# Patient Record
Sex: Male | Born: 1975 | Race: White | Hispanic: No | Marital: Single | Smoking: Never smoker
Health system: Southern US, Community
[De-identification: ages and names within clinical notes are randomized; demographics above are authoritative.]

## PROBLEM LIST (undated history)

## (undated) DIAGNOSIS — R002 Palpitations: Secondary | ICD-10-CM

## (undated) DIAGNOSIS — J45909 Unspecified asthma, uncomplicated: Secondary | ICD-10-CM

## (undated) DIAGNOSIS — I10 Essential (primary) hypertension: Secondary | ICD-10-CM

## (undated) HISTORY — DX: Essential (primary) hypertension: I10

## (undated) HISTORY — DX: Palpitations: R00.2

## (undated) HISTORY — DX: Unspecified asthma, uncomplicated: J45.909

## (undated) HISTORY — PX: ANTERIOR CRUCIATE LIGAMENT REPAIR: SHX115

---

## 1998-07-20 ENCOUNTER — Emergency Department (HOSPITAL_COMMUNITY): Admission: EM | Admit: 1998-07-20 | Discharge: 1998-07-20 | Payer: Self-pay | Admitting: Emergency Medicine

## 1999-04-19 ENCOUNTER — Encounter: Payer: Self-pay | Admitting: Emergency Medicine

## 1999-04-19 ENCOUNTER — Emergency Department (HOSPITAL_COMMUNITY): Admission: EM | Admit: 1999-04-19 | Discharge: 1999-04-19 | Payer: Self-pay | Admitting: Emergency Medicine

## 1999-08-09 ENCOUNTER — Emergency Department (HOSPITAL_COMMUNITY): Admission: EM | Admit: 1999-08-09 | Discharge: 1999-08-10 | Payer: Self-pay | Admitting: Emergency Medicine

## 2000-07-03 ENCOUNTER — Emergency Department (HOSPITAL_COMMUNITY): Admission: EM | Admit: 2000-07-03 | Discharge: 2000-07-03 | Payer: Self-pay | Admitting: *Deleted

## 2006-05-30 ENCOUNTER — Ambulatory Visit: Payer: Self-pay | Admitting: Internal Medicine

## 2006-08-04 ENCOUNTER — Ambulatory Visit: Payer: Self-pay | Admitting: Orthopedic Surgery

## 2006-08-22 ENCOUNTER — Ambulatory Visit: Payer: Self-pay | Admitting: Specialist

## 2006-12-05 ENCOUNTER — Emergency Department (HOSPITAL_COMMUNITY): Admission: EM | Admit: 2006-12-05 | Discharge: 2006-12-05 | Payer: Self-pay | Admitting: Family Medicine

## 2007-01-23 ENCOUNTER — Emergency Department (HOSPITAL_COMMUNITY): Admission: EM | Admit: 2007-01-23 | Discharge: 2007-01-23 | Payer: Self-pay | Admitting: Family Medicine

## 2007-02-24 ENCOUNTER — Emergency Department (HOSPITAL_COMMUNITY): Admission: EM | Admit: 2007-02-24 | Discharge: 2007-02-24 | Payer: Self-pay | Admitting: Emergency Medicine

## 2010-10-10 ENCOUNTER — Emergency Department (HOSPITAL_COMMUNITY)
Admission: EM | Admit: 2010-10-10 | Discharge: 2010-10-10 | Payer: Self-pay | Source: Home / Self Care | Admitting: Emergency Medicine

## 2013-10-01 ENCOUNTER — Ambulatory Visit: Payer: Self-pay | Attending: Internal Medicine | Admitting: Internal Medicine

## 2013-10-01 VITALS — BP 134/87 | HR 112 | Temp 97.7°F | Resp 17

## 2013-10-01 DIAGNOSIS — R079 Chest pain, unspecified: Secondary | ICD-10-CM

## 2013-10-01 DIAGNOSIS — I10 Essential (primary) hypertension: Secondary | ICD-10-CM | POA: Insufficient documentation

## 2013-10-01 DIAGNOSIS — J45909 Unspecified asthma, uncomplicated: Secondary | ICD-10-CM | POA: Insufficient documentation

## 2013-10-01 DIAGNOSIS — R002 Palpitations: Secondary | ICD-10-CM | POA: Insufficient documentation

## 2013-10-01 MED ORDER — ALBUTEROL SULFATE HFA 108 (90 BASE) MCG/ACT IN AERS: 2.0000 | INHALATION_SPRAY | Freq: Four times a day (QID) | RESPIRATORY_TRACT | Status: DC | PRN

## 2013-10-01 MED ORDER — LISINOPRIL-HYDROCHLOROTHIAZIDE 20-12.5 MG PO TABS: 1.0000 | ORAL_TABLET | Freq: Every day | ORAL | Status: DC

## 2013-10-01 NOTE — Progress Notes (Signed)
Patient ID: Ian Cochran, male   DOB: Jun 14, 1976, 37 y.o.   MRN: [id number]   Chief complaints Establish care  History of present illness 37 year old obese male with history of asthma and recently diagnosed for hypertension at an urgent care comes in to establish care. Patient reports that for the last 2 weeks he has been experiencing some palpitations on  exertion and climbing stairs. He denies any headache, blurred vision, dizziness, chest pain, fever, chills, nausea, vomiting, abdominal pain, leg swelling, bowel or urinary symptoms. Since he was diagnosed of high blood pressure 3 months back he has been monitoring his diet and trying to exercise in order to lose weight. He denies smoking, alcohol use or illicit drug use. He reports mild shortness of breath on exertion. He also feels more tired than usual and has been stressed out regarding his mother's health and himself being unemployed.  Vital signs in last 24 hours:  Filed Vitals:   10/01/13 1430  BP: 134/87  Pulse: 112  Temp: 97.7 F (36.5 C)  Resp: 17      Physical Exam:  General: Middle aged male in no acute distress. HEENT: no pallor, no icterus, moist oral mucosa, no JVD,  Heart: Normal  s1 &s2  Regular rate and rhythm, without murmurs, rubs, gallops. Lungs: Clear to auscultation bilaterally. Abdomen: Soft, nontender, nondistended, positive bowel sounds. Extremities: Warm, no edema Neuro: Alert, awake, oriented x3, nonfocal.   Lab Results:  Basic Metabolic Panel: No results found for this basename: na, k, cl, co2, bun, creatinine, glucose, calcium   CBC: No results found for this basename: wbc, hgb, hct, plt, mcv, neutroabs, lymphsabs, monoabs, eosabs, basosabs    No results found for this or any previous visit (from the past 240 hour(s)).  Studies/Results: No results found.  Medications: Reviewed  EKG: Normal sinus rhythm at 95, no ST T changes  Assessment/Plan: Hypertension Blood pressure stable.  I will refill his prescription for HCTZ and lisinopril. Encouraged on monitoring his diet with low-salt intake, exercising regularly in order to lose weight.  Palpitations Possibly related to stress  EKG unremarkable. Will check BMET , TSH and free T4  Asthma  will refill albuterol inhaler  Follow up in 1 month  Braedyn Kauk 10/01/2013, 2:52 PM

## 2013-10-01 NOTE — Addendum Note (Signed)
Addended by: Eddie North on: 10/01/2013 03:05 PM   Modules accepted: Orders

## 2013-10-01 NOTE — Progress Notes (Signed)
Patient here to establish care Has history of HTN Lately has been having so chest fluttering on  exhertion

## 2013-10-02 LAB — BASIC METABOLIC PANEL
BUN: 14 mg/dL (ref 6–23)
CO2: 27 mEq/L (ref 19–32)
Calcium: 9.7 mg/dL (ref 8.4–10.5)
Chloride: 105 mEq/L (ref 96–112)
Creat: 1.15 mg/dL (ref 0.50–1.35)
Glucose, Bld: 121 mg/dL — ABNORMAL HIGH (ref 70–99)
Potassium: 3.9 mEq/L (ref 3.5–5.3)
Sodium: 140 mEq/L (ref 135–145)

## 2013-10-02 LAB — T4, FREE: Free T4: 1.11 ng/dL (ref 0.80–1.80)

## 2013-10-02 LAB — TSH: TSH: 0.735 u[IU]/mL (ref 0.350–4.500)

## 2013-10-19 ENCOUNTER — Ambulatory Visit: Payer: Self-pay

## 2013-10-21 ENCOUNTER — Other Ambulatory Visit: Payer: Self-pay

## 2013-10-29 ENCOUNTER — Ambulatory Visit: Payer: Self-pay | Attending: Internal Medicine | Admitting: Internal Medicine

## 2013-10-29 ENCOUNTER — Encounter: Payer: Self-pay | Admitting: Internal Medicine

## 2013-10-29 VITALS — BP 149/88 | HR 76 | Temp 98.6°F | Resp 14 | Ht 73.2 in

## 2013-10-29 DIAGNOSIS — R002 Palpitations: Secondary | ICD-10-CM

## 2013-10-29 MED ORDER — NEBIVOLOL HCL 5 MG PO TABS: 5.0000 mg | ORAL_TABLET | Freq: Every day | ORAL | Status: DC

## 2013-10-29 MED ORDER — ASPIRIN EC 81 MG PO TBEC: 81.0000 mg | DELAYED_RELEASE_TABLET | Freq: Every day | ORAL | Status: AC

## 2013-10-29 MED ORDER — FLUTICASONE-SALMETEROL 100-50 MCG/DOSE IN AEPB: 1.0000 | INHALATION_SPRAY | Freq: Two times a day (BID) | RESPIRATORY_TRACT | Status: DC

## 2013-10-29 NOTE — Patient Instructions (Signed)
If you develop persistent palpitations, any chest pain or shortness of breath called 911 and go to ER.

## 2013-10-29 NOTE — Progress Notes (Unsigned)
Patient ID: Ian Cochran, male   DOB: 12/18/1975, 37 y.o.   MRN: [id number]  Patient Demographics  Ian Cochran, is a 37 y.o. male  [id number]:[id number]  [id number]  DOB - June 18, 1976  No chief complaint on file.       Subjective:   Ian Cochran with History hypertension, asthma was been using his albuterol inhaler more often lately, presents with chief complaints of palpitations for which she was seen here last week, he describes this as skipped heart beat, it happens intermittently on a daily basis, no chest pain no shortness of breath, no chest tightness or heaviness. He denies smoking or drinking alcohol, denies any personal history of CAD or CHF, no family history of CAD at young age.  Denies any subjective complaints except as above, no active headache, no chest abdominal pain at this time, not short of breath. No focal weakness which is new.   Objective:    Patient Active Problem List   Diagnosis Date Noted  . Essential hypertension, benign 10/01/2013  . Palpitations 10/01/2013  . Unspecified asthma(493.90) 10/01/2013     Filed Vitals:   10/29/13 1742  BP: 149/88  Pulse: 76  Temp: 98.6 F (37 C)  Resp: 14  Height: 6' 1.2" (1.859 m)  SpO2: 98%     Exam   Awake Alert, Oriented X 3, No new F.N deficits, Normal affect [place].AT,PERRAL Supple Neck,No JVD, No cervical lymphadenopathy appriciated.  Symmetrical Chest wall movement, Good air movement bilaterally, CTAB RRR,No Gallops,Rubs or new Murmurs, No Parasternal Heave +ve B.Sounds, Abd Soft, Non tender, No organomegaly appriciated, No rebound - guarding or rigidity. No Cyanosis, Clubbing or edema, No new Rash or bruise      Data Review   No results found for this basename: WBC,  HGB,  HCT,  MCV,  PLT      Chemistry      Component Value Date/Time   NA 140 10/01/2013 1507   K 3.9 10/01/2013 1507   CL 105 10/01/2013 1507   CO2 27 10/01/2013 1507   BUN 14 10/01/2013 1507   CREATININE 1.15 10/01/2013 1507       Component Value Date/Time   CALCIUM 9.7 10/01/2013 1507       No results found for this basename: HGBA1C    No results found for this basename: CHOL,  HDL,  LDLCALC,  LDLDIRECT,  TRIG,  CHOLHDL    Lab Results  Component Value Date   TSH 0.735 10/01/2013    No results found for this basename: PSA      Prior to Admission medications   Medication Sig Start Date End Date Taking? Authorizing Provider  albuterol (PROVENTIL HFA;VENTOLIN HFA) 108 (90 BASE) MCG/ACT inhaler Inhale 2 puffs into the lungs every 6 (six) hours as needed for wheezing. 10/01/13   Nishant Dhungel, MD  aspirin EC 81 MG tablet Take 1 tablet (81 mg total) by mouth daily. 10/29/13   Leroy Sea, MD  Fluticasone-Salmeterol (ADVAIR) 100-50 MCG/DOSE AEPB Inhale 1 puff into the lungs 2 (two) times daily. 10/29/13   Leroy Sea, MD  lisinopril-hydrochlorothiazide (PRINZIDE,ZESTORETIC) 20-12.5 MG per tablet Take 1 tablet by mouth daily. 10/01/13   Nishant Dhungel, MD  nebivolol (BYSTOLIC) 5 MG tablet Take 1 tablet (5 mg total) by mouth daily. 10/29/13   Leroy Sea, MD     Assessment & Plan    Palpitations which he describes as a few skipped beats - no chest pain, no palpitations currently, these palpitations or intermittent,  question if these are PVCs, EKG done yesterday shows sinus rhythm with a rate in mid 70s, also EKG done last week also showed sinus rhythm , no PVCs, his recent BMP and TSH are stable, will check A1c. Will order echo gram, outpatient cardiology consult will be ordered also. We'll now we'll place him on aspirin and Bystolic.   Asthma. Has been using albuterol inhaler every few days, will place him on Advair Diskus, question if albuterol is contributing to his palpitations above.   Hypertension. On  ACE inhibitor HCTZ combination, blood pressure in poor control, will add Bystolic.    Leroy Sea M.D on 10/29/2013 at 5:50 PM    Patient was given clear instructions  to go to ER or return to the clinic if symptoms don't improve, worsen or new problems develop. Patient verbalized understanding. Patient was told to call to get lab results if hasn't heard anything in the next week.

## 2013-10-29 NOTE — Progress Notes (Unsigned)
Pt here with c/o heart palpitations that is getting worse Denies CP BP-149/88 taking prescribed lisinopril EKG obtained

## 2013-10-30 LAB — HEMOGLOBIN A1C: Mean Plasma Glucose: 140 mg/dL — ABNORMAL HIGH (ref <117)

## 2013-11-01 ENCOUNTER — Ambulatory Visit: Payer: Self-pay

## 2013-11-01 ENCOUNTER — Telehealth: Payer: Self-pay | Admitting: Emergency Medicine

## 2013-11-01 MED ORDER — METFORMIN HCL 500 MG PO TABS: 500.0000 mg | ORAL_TABLET | Freq: Two times a day (BID) | ORAL | Status: DC

## 2013-11-01 MED ORDER — METFORMIN HCL 1000 MG PO TABS: 1000.0000 mg | ORAL_TABLET | Freq: Two times a day (BID) | ORAL | Status: DC

## 2013-11-01 NOTE — Progress Notes (Signed)
Quick Note:  Patient is borderline diabetes Glucophage called into the pharmacy, repeat A1c in 3 weeks, should go on low carbohydrate heart healthy diet. ______

## 2013-11-01 NOTE — Progress Notes (Signed)
Pt given lab results. Scheduled 3 mnth f/u lab visit and informed pt to pick Metformin up from pharmacy

## 2013-11-01 NOTE — Progress Notes (Signed)
Quick Note:  Please let patient know he is borderline diabetic, Glucophage called in, he should go on low carb heart healthy diet. Come back in 3 months for repeat A1c. ______

## 2013-11-01 NOTE — Telephone Encounter (Signed)
Message copied by Darlis Loan on Mon Nov 01, 2013 12:49 PM ------      Message from: [facility], [facility]vest K      Created: Mon Nov 01, 2013  9:09 AM       Please let patient know he is borderline diabetic, Glucophage called in, he should go on low carb heart healthy diet. Come back in 3 months for repeat A1c. ------

## 2013-11-09 ENCOUNTER — Other Ambulatory Visit: Payer: Self-pay | Admitting: Internal Medicine

## 2013-11-09 MED ORDER — CARVEDILOL 6.25 MG PO TABS: 6.2500 mg | ORAL_TABLET | Freq: Two times a day (BID) | ORAL | Status: DC

## 2013-11-10 ENCOUNTER — Other Ambulatory Visit: Payer: Self-pay | Admitting: Internal Medicine

## 2013-11-10 MED ORDER — ALBUTEROL SULFATE HFA 108 (90 BASE) MCG/ACT IN AERS: 2.0000 | INHALATION_SPRAY | Freq: Four times a day (QID) | RESPIRATORY_TRACT | Status: DC | PRN

## 2013-11-10 MED ORDER — FLUTICASONE-SALMETEROL 100-50 MCG/DOSE IN AEPB: 1.0000 | INHALATION_SPRAY | Freq: Two times a day (BID) | RESPIRATORY_TRACT | Status: DC

## 2013-11-10 MED ORDER — NEBIVOLOL HCL 5 MG PO TABS: 5.0000 mg | ORAL_TABLET | Freq: Every day | ORAL | Status: DC

## 2013-11-17 ENCOUNTER — Encounter: Payer: Self-pay | Admitting: Cardiology

## 2013-11-17 ENCOUNTER — Ambulatory Visit: Payer: Self-pay | Attending: Cardiology | Admitting: Cardiology

## 2013-11-17 VITALS — BP 146/75 | HR 105 | Temp 98.9°F | Resp 18 | Ht 76.0 in | Wt 299.0 lb

## 2013-11-17 DIAGNOSIS — E669 Obesity, unspecified: Secondary | ICD-10-CM | POA: Insufficient documentation

## 2013-11-17 DIAGNOSIS — R002 Palpitations: Secondary | ICD-10-CM | POA: Insufficient documentation

## 2013-11-17 DIAGNOSIS — I10 Essential (primary) hypertension: Secondary | ICD-10-CM

## 2013-11-17 NOTE — Progress Notes (Signed)
Pt is here f/u Dr. Daleen Squibb Heart palpitations C/o episodes achy jaw pain,nonradiating pain x 1 week Denies CP

## 2013-11-17 NOTE — Progress Notes (Signed)
HPI Mr Fandino comes in today referred by Dr.Singh  For the evaluation of palpitations.  These occur both at rest and with some activity. He does do a lot worse when he was using his albuterol inhaler. He now has access to Advair.  EKG is normal.  He's had intermittent episodes of jaw pain relieved by massage of his temporomandibular joints and opening and closing his mouth. He does not have exertional symptoms.  He is anxious to get back to the gym exercising and losing weight.   He has not filled Bystolic secondary to financial reasons. We're working on getting this done today.  Past Medical History  Diagnosis Date  . Hypertension   . Asthma   . Palpitation     Current Outpatient Prescriptions  Medication Sig Dispense Refill  . albuterol (PROVENTIL HFA;VENTOLIN HFA) 108 (90 BASE) MCG/ACT inhaler Inhale 2 puffs into the lungs every 6 (six) hours as needed for wheezing.  18 g  1 year  . aspirin EC 81 MG tablet Take 1 tablet (81 mg total) by mouth daily.  30 tablet  0  . Fluticasone-Salmeterol (ADVAIR) 100-50 MCG/DOSE AEPB Inhale 1 puff into the lungs 2 (two) times daily.  60 each  1 year  . lisinopril-hydrochlorothiazide (PRINZIDE,ZESTORETIC) 20-12.5 MG per tablet Take 1 tablet by mouth daily.  30 tablet  5  . metFORMIN (GLUCOPHAGE) 500 MG tablet Take 1 tablet (500 mg total) by mouth 2 (two) times daily with a meal.  60 tablet  3  . carvedilol (COREG) 6.25 MG tablet Take 1 tablet (6.25 mg total) by mouth 2 (two) times daily with a meal.  60 tablet  3  . nebivolol (BYSTOLIC) 5 MG tablet Take 1 tablet (5 mg total) by mouth daily.  90 tablet  1 year   No current facility-administered medications for this visit.    Allergies  Allergen Reactions  . Oxycodone     Family History  Problem Relation Age of Onset  . Diabetes Mother   . Heart disease Sister     History   Social History  . Marital Status: Single    Spouse Name: N/A    Number of Children: N/A  . Years of Education:  N/A   Occupational History  . Not on file.   Social History Main Topics  . Smoking status: Never Smoker   . Smokeless tobacco: Not on file  . Alcohol Use: Not on file  . Drug Use: Not on file  . Sexual Activity: Not on file   Other Topics Concern  . Not on file   Social History Narrative  . No narrative on file    ROS ALL NEGATIVE EXCEPT THOSE NOTED IN HPI  PE  General Appearance: well developed, well nourished in no acute distress, obese, large tall man HEENT: symmetrical face, PERRLA, good dentition  Neck: no JVD, thyromegaly, or adenopathy, trachea midline Chest: symmetric without deformity Cardiac: PMI non-displaced, RRR, normal S1, S2, no gallop or murmur Lung: clear to ausculation and percussion Vascular: all pulses full without bruits  Abdominal: nondistended, nontender, good bowel sounds, no HSM, no bruits Extremities: no cyanosis, clubbing or edema, no sign of DVT, no varicosities  Skin: normal color, no rashes Neuro: alert and oriented x 3, non-focal Pysch: normal affect  EKG  BMET    Component Value Date/Time   NA 140 10/01/2013 1507   K 3.9 10/01/2013 1507   CL 105 10/01/2013 1507   CO2 27 10/01/2013 1507   GLUCOSE 121*  10/01/2013 1507   BUN 14 10/01/2013 1507   CREATININE 1.15 10/01/2013 1507   CALCIUM 9.7 10/01/2013 1507    Lipid Panel  No results found for this basename: chol, trig, hdl, cholhdl, vldl, ldlcalc    CBC No results found for this basename: wbc, rbc, hgb, hct, plt, mcv, mch, mchc, rdw, neutrabs, lymphsabs, monoabs, eosabs, basosabs

## 2013-11-17 NOTE — Assessment & Plan Note (Signed)
I think these are most likely benign. I've asked him to get Bystolic filled and this should help with his blood pressure and palpitations. I cleared him to go back to exercising. We will see him when necessary.

## 2013-11-18 ENCOUNTER — Ambulatory Visit (HOSPITAL_COMMUNITY): Payer: Self-pay

## 2013-12-07 ENCOUNTER — Ambulatory Visit (HOSPITAL_COMMUNITY): Payer: Self-pay

## 2014-01-03 ENCOUNTER — Ambulatory Visit (HOSPITAL_COMMUNITY)
Admission: RE | Admit: 2014-01-03 | Discharge: 2014-01-03 | Disposition: A | Discharge status: Home / Self Care | Payer: Self-pay | Source: Ambulatory Visit | Attending: Internal Medicine | Admitting: Internal Medicine

## 2014-01-03 DIAGNOSIS — I10 Essential (primary) hypertension: Secondary | ICD-10-CM | POA: Insufficient documentation

## 2014-01-03 DIAGNOSIS — R002 Palpitations: Secondary | ICD-10-CM | POA: Insufficient documentation

## 2014-01-03 DIAGNOSIS — I517 Cardiomegaly: Secondary | ICD-10-CM

## 2014-01-03 NOTE — Progress Notes (Signed)
Echo Lab  2D Echocardiogram completed.  Ian Cochran, RDCS 01/03/2014 10:14 AM

## 2014-02-01 ENCOUNTER — Other Ambulatory Visit: Payer: Self-pay

## 2014-02-16 ENCOUNTER — Ambulatory Visit: Payer: Self-pay | Attending: Internal Medicine

## 2014-02-16 DIAGNOSIS — E119 Type 2 diabetes mellitus without complications: Secondary | ICD-10-CM

## 2014-02-16 LAB — POCT GLYCOSYLATED HEMOGLOBIN (HGB A1C): Hemoglobin A1C: 6

## 2014-03-21 ENCOUNTER — Ambulatory Visit: Payer: Self-pay | Admitting: Internal Medicine

## 2014-03-21 ENCOUNTER — Other Ambulatory Visit: Payer: Self-pay

## 2014-03-21 MED ORDER — CARVEDILOL 6.25 MG PO TABS: 6.2500 mg | ORAL_TABLET | Freq: Two times a day (BID) | ORAL | Status: DC

## 2014-03-21 MED ORDER — LISINOPRIL-HYDROCHLOROTHIAZIDE 20-12.5 MG PO TABS: 1.0000 | ORAL_TABLET | Freq: Every day | ORAL | Status: DC

## 2014-04-28 ENCOUNTER — Encounter: Payer: Self-pay | Admitting: Internal Medicine

## 2014-04-28 ENCOUNTER — Ambulatory Visit: Payer: Self-pay | Attending: Internal Medicine | Admitting: Internal Medicine

## 2014-04-28 VITALS — BP 142/96 | HR 79 | Temp 99.5°F | Resp 16 | Wt 193.4 lb

## 2014-04-28 DIAGNOSIS — E119 Type 2 diabetes mellitus without complications: Secondary | ICD-10-CM | POA: Insufficient documentation

## 2014-04-28 DIAGNOSIS — J45909 Unspecified asthma, uncomplicated: Secondary | ICD-10-CM | POA: Insufficient documentation

## 2014-04-28 DIAGNOSIS — Z6839 Body mass index (BMI) 39.0-39.9, adult: Secondary | ICD-10-CM | POA: Insufficient documentation

## 2014-04-28 DIAGNOSIS — I10 Essential (primary) hypertension: Secondary | ICD-10-CM | POA: Insufficient documentation

## 2014-04-28 DIAGNOSIS — E669 Obesity, unspecified: Secondary | ICD-10-CM | POA: Insufficient documentation

## 2014-04-28 DIAGNOSIS — Z09 Encounter for follow-up examination after completed treatment for conditions other than malignant neoplasm: Secondary | ICD-10-CM | POA: Insufficient documentation

## 2014-04-28 LAB — COMPLETE METABOLIC PANEL WITH GFR
ALK PHOS: 42 U/L (ref 39–117)
ALT: 14 U/L (ref 0–53)
AST: 14 U/L (ref 0–37)
Albumin: 4.8 g/dL (ref 3.5–5.2)
BILIRUBIN TOTAL: 0.3 mg/dL (ref 0.2–1.2)
BUN: 16 mg/dL (ref 6–23)
CO2: 29 mEq/L (ref 19–32)
Calcium: 9.6 mg/dL (ref 8.4–10.5)
Chloride: 101 mEq/L (ref 96–112)
Creat: 1.13 mg/dL (ref 0.50–1.35)
GFR, Est African American: >89 mL/min
GFR, Est Non African American: 82 mL/min
GLUCOSE: 102 mg/dL — AB (ref 70–99)
Potassium: 4.6 mEq/L (ref 3.5–5.3)
SODIUM: 140 meq/L (ref 135–145)
Total Protein: 7.4 g/dL (ref 6.0–8.3)

## 2014-04-28 LAB — LIPID PANEL
CHOL/HDL RATIO: 4.5 ratio
CHOLESTEROL: 163 mg/dL (ref 0–200)
HDL: 36 mg/dL — ABNORMAL LOW (ref >39)
LDL CALC: 110 mg/dL — AB (ref 0–99)
Triglycerides: 84 mg/dL (ref <150)
VLDL: 17 mg/dL (ref 0–40)

## 2014-04-28 LAB — GLUCOSE, POCT (MANUAL RESULT ENTRY): POC GLUCOSE: 95 mg/dL (ref 70–99)

## 2014-04-28 MED ORDER — CARVEDILOL 6.25 MG PO TABS: 6.2500 mg | ORAL_TABLET | Freq: Two times a day (BID) | ORAL | Status: DC

## 2014-04-28 MED ORDER — LISINOPRIL-HYDROCHLOROTHIAZIDE 20-12.5 MG PO TABS: 1.0000 | ORAL_TABLET | Freq: Every day | ORAL | Status: DC

## 2014-04-28 NOTE — Patient Instructions (Addendum)
Diabetes Meal Planning Guide The diabetes meal planning guide is a tool to help you plan your meals and snacks. It is important for people with diabetes to manage their blood glucose (sugar) levels. Choosing the right foods and the right amounts throughout your day will help control your blood glucose. Eating right can even help you improve your blood pressure and reach or maintain a healthy weight. CARBOHYDRATE COUNTING MADE EASY When you eat carbohydrates, they turn to sugar. This raises your blood glucose level. Counting carbohydrates can help you control this level so you feel better. When you plan your meals by counting carbohydrates, you can have more flexibility in what you eat and balance your medicine with your food intake. Carbohydrate counting simply means adding up the total amount of carbohydrate grams in your meals and snacks. Try to eat about the same amount at each meal. Foods with carbohydrates are listed below. Each portion below is 1 carbohydrate serving or 15 grams of carbohydrates. Ask your dietician how many grams of carbohydrates you should eat at each meal or snack. Grains and Starches  1 slice bread.   English muffin or hotdog/hamburger bun.   cup cold cereal (unsweetened).   cup cooked pasta or rice.   cup starchy vegetables (corn, potatoes, peas, beans, winter squash).  1 tortilla (6 inches).   bagel.  1 waffle or pancake (size of a CD).   cup cooked cereal.  4 to 6 small crackers. *Whole grain is recommended. Fruit  1 cup fresh unsweetened berries, melon, papaya, pineapple.  1 small fresh fruit.   banana or mango.   cup fruit juice (4 oz unsweetened).   cup canned fruit in natural juice or water.  2 tbs dried fruit.  12 to 15 grapes or cherries. Milk and Yogurt  1 cup fat-free or 1% milk.  1 cup soy milk.  6 oz light yogurt with sugar-free sweetener.  6 oz low-fat soy yogurt.  6 oz plain yogurt. Vegetables  1 cup raw or  cup  cooked is counted as 0 carbohydrates or a "free" food.  If you eat 3 or more servings at 1 meal, count them as 1 carbohydrate serving. Other Carbohydrates   oz chips or pretzels.   cup ice cream or frozen yogurt.   cup sherbet or sorbet.  2 inch square cake, no frosting.  1 tbs honey, sugar, jam, jelly, or syrup.  2 small cookies.  3 squares of graham crackers.  3 cups popcorn.  6 crackers.  1 cup broth-based soup.  Count 1 cup casserole or other mixed foods as 2 carbohydrate servings.  Foods with less than 20 calories in a serving may be counted as 0 carbohydrates or a "free" food. You may want to purchase a book or computer software that lists the carbohydrate gram counts of different foods. In addition, the nutrition facts panel on the labels of the foods you eat are a good source of this information. The label will tell you how big the serving size is and the total number of carbohydrate grams you will be eating per serving. Divide this number by 15 to obtain the number of carbohydrate servings in a portion. Remember, 1 carbohydrate serving equals 15 grams of carbohydrate. SERVING SIZES Measuring foods and serving sizes helps you make sure you are getting the right amount of food. The list below tells how big or small some common serving sizes are.  1 oz.........4 stacked dice.  3 oz.........Deck of cards.  1 tsp........Tip   of little finger.  1 tbs........Thumb.  2 tbs........Golf ball.   cup.......Half of a fist.  1 cup........A fist. SAMPLE DIABETES MEAL PLAN Below is a sample meal plan that includes foods from the grain and starches, dairy, vegetable, fruit, and meat groups. A dietician can individualize a meal plan to fit your calorie needs and tell you the number of servings needed from each food group. However, controlling the total amount of carbohydrates in your meal or snack is more important than making sure you include all of the food groups at every  meal. You may interchange carbohydrate containing foods (dairy, starches, and fruits). The meal plan below is an example of a 2000 calorie diet using carbohydrate counting. This meal plan has 17 carbohydrate servings. Breakfast  1 cup oatmeal (2 carb servings).   cup light yogurt (1 carb serving).  1 cup blueberries (1 carb serving).   cup almonds. Snack  1 large apple (2 carb servings).  1 low-fat string cheese stick. Lunch  Chicken breast salad.  1 cup spinach.   cup chopped tomatoes.  2 oz chicken breast, sliced.  2 tbs low-fat Italian dressing.  12 whole-wheat crackers (2 carb servings).  12 to 15 grapes (1 carb serving).  1 cup low-fat milk (1 carb serving). Snack  1 cup carrots.   cup hummus (1 carb serving). Dinner  3 oz broiled salmon.  1 cup brown rice (3 carb servings). Snack  1  cups steamed broccoli (1 carb serving) drizzled with 1 tsp olive oil and lemon juice.  1 cup light pudding (2 carb servings). DIABETES MEAL PLANNING WORKSHEET Your dietician can use this worksheet to help you decide how many servings of foods and what types of foods are right for you.  BREAKFAST Food Group and Servings / Carb Servings Grain/Starches __________________________________ Dairy __________________________________________ Vegetable ______________________________________ Fruit ___________________________________________ Meat __________________________________________ Fat ____________________________________________ LUNCH Food Group and Servings / Carb Servings Grain/Starches ___________________________________ Dairy ___________________________________________ Fruit ____________________________________________ Meat ___________________________________________ Fat _____________________________________________ DINNER Food Group and Servings / Carb Servings Grain/Starches ___________________________________ Dairy  ___________________________________________ Fruit ____________________________________________ Meat ___________________________________________ Fat _____________________________________________ SNACKS Food Group and Servings / Carb Servings Grain/Starches ___________________________________ Dairy ___________________________________________ Vegetable _______________________________________ Fruit ____________________________________________ Meat ___________________________________________ Fat _____________________________________________ DAILY TOTALS Starches _________________________ Vegetable ________________________ Fruit ____________________________ Dairy ____________________________ Meat ____________________________ Fat ______________________________ Document Released: 08/29/2005 Document Revised: 02/24/2012 Document Reviewed: 07/10/2009 [employer] Patient Information 2014 [employer] DASH Diet The DASH diet stands for "Dietary Approaches to Stop Hypertension." It is a healthy eating plan that has been shown to reduce high blood pressure (hypertension) in as little as 14 days, while also possibly providing other significant health benefits. These other health benefits include reducing the risk of breast cancer after menopause and reducing the risk of type 2 diabetes, heart disease, colon cancer, and stroke. Health benefits also include weight loss and slowing kidney failure in patients with chronic kidney disease.  DIET GUIDELINES  Limit salt (sodium). Your diet should contain less than 1500 mg of sodium daily.  Limit refined or processed carbohydrates. Your diet should include mostly whole grains. Desserts and added sugars should be used sparingly.  Include small amounts of heart-healthy fats. These types of fats include nuts, oils, and tub margarine. Limit saturated and trans fats. These fats have been shown to be harmful in the body. CHOOSING FOODS  The following food groups  are based on a 2000 calorie diet. See your Registered Dietitian for individual calorie needs. Grains and Grain Products (6 to 8 servings daily)  Eat More Often:   Whole-wheat bread, brown rice, whole-grain or wheat pasta, quinoa, popcorn without added fat or salt (air popped).  Eat Less Often: White bread, white pasta, white rice, cornbread. Vegetables (4 to 5 servings daily)  Eat More Often: Fresh, frozen, and canned vegetables. Vegetables may be raw, steamed, roasted, or grilled with a minimal amount of fat.  Eat Less Often/Avoid: Creamed or fried vegetables. Vegetables in a cheese sauce. Fruit (4 to 5 servings daily)  Eat More Often: All fresh, canned (in natural juice), or frozen fruits. Dried fruits without added sugar. One hundred percent fruit juice ( cup [237 mL] daily).  Eat Less Often: Dried fruits with added sugar. Canned fruit in light or heavy syrup. Lean Meats, Fish, and Poultry (2 servings or less daily. One serving is 3 to 4 oz [85-114 g]).  Eat More Often: Ninety percent or leaner ground beef, tenderloin, sirloin. Round cuts of beef, chicken breast, turkey breast. All fish. Grill, bake, or broil your meat. Nothing should be fried.  Eat Less Often/Avoid: Fatty cuts of meat, turkey, or chicken leg, thigh, or wing. Fried cuts of meat or fish. Dairy (2 to 3 servings)  Eat More Often: Low-fat or fat-free milk, low-fat plain or light yogurt, reduced-fat or part-skim cheese.  Eat Less Often/Avoid: Milk (whole, 2%).Whole milk yogurt. Full-fat cheeses. Nuts, Seeds, and Legumes (4 to 5 servings per week)  Eat More Often: All without added salt.  Eat Less Often/Avoid: Salted nuts and seeds, canned beans with added salt. Fats and Sweets (limited)  Eat More Often: Vegetable oils, tub margarines without trans fats, sugar-free gelatin. Mayonnaise and salad dressings.  Eat Less Often/Avoid: Coconut oils, palm oils, butter, stick margarine, cream, half and half, cookies, candy,  pie. FOR MORE INFORMATION The Dash Diet Eating Plan: www.dashdiet.org Document Released: 11/21/2011 Document Revised: 02/24/2012 Document Reviewed: 11/21/2011 [employer] Patient Information 2014 [employer].  

## 2014-04-28 NOTE — Progress Notes (Signed)
MRN: [id number] Name: Ian FilesLarry Darnell Warburton  Sex: male Age: 38 y.o. DOB: [birth year]  Allergies: Oxycodone  Chief Complaint  Patient presents with  . Follow-up    HPI: Patient is 38 y.o. male who has history of diabetes, hypertension, palpitation comes today for followup, has borderline elevated blood pressure denies any headache dizziness chest pain or shortness of breath patient is requesting refill on his medication he's taking Coreg as well as lisinopril/hydrochlorothiazide, as per patient he used to be on Bystolic in the past which was discontinued and currently on Coreg which helps him with the palpitations, patient also used to be on metformin which he discontinued and has been trying to modify his diet, his last hemoglobin A1c was 6.0%. Patient denies smoking cigarettes history of asthma and is on Advair and albuterol when necessary.  Past Medical History  Diagnosis Date  . Hypertension   . Asthma   . Palpitation     History reviewed. No pertinent past surgical history.    Medication List       This list is accurate as of: 04/28/14  9:24 AM.  Always use your most recent med list.               albuterol 108 (90 BASE) MCG/ACT inhaler  Commonly known as:  PROVENTIL HFA;VENTOLIN HFA  Inhale 2 puffs into the lungs every 6 (six) hours as needed for wheezing.     aspirin EC 81 MG tablet  Take 1 tablet (81 mg total) by mouth daily.     carvedilol 6.25 MG tablet  Commonly known as:  COREG  Take 1 tablet (6.25 mg total) by mouth 2 (two) times daily with a meal.     Fluticasone-Salmeterol 100-50 MCG/DOSE Aepb  Commonly known as:  ADVAIR  Inhale 1 puff into the lungs 2 (two) times daily.     lisinopril-hydrochlorothiazide 20-12.5 MG per tablet  Commonly known as:  PRINZIDE,ZESTORETIC  Take 1 tablet by mouth daily.     metFORMIN 500 MG tablet  Commonly known as:  GLUCOPHAGE  Take 1 tablet (500 mg total) by mouth 2 (two) times daily with a meal.        Meds  ordered this encounter  Medications  . carvedilol (COREG) 6.25 MG tablet    Sig: Take 1 tablet (6.25 mg total) by mouth 2 (two) times daily with a meal.    Dispense:  60 tablet    Refill:  3  . lisinopril-hydrochlorothiazide (PRINZIDE,ZESTORETIC) 20-12.5 MG per tablet    Sig: Take 1 tablet by mouth daily.    Dispense:  30 tablet    Refill:  5     There is no immunization history on file for this patient.  Family History  Problem Relation Age of Onset  . Diabetes Mother   . Heart disease Sister     History  Substance Use Topics  . Smoking status: Never Smoker   . Smokeless tobacco: Not on file  . Alcohol Use: Not on file    Review of Systems   As noted in HPI  Filed Vitals:   04/28/14 0910  BP: 142/96  Pulse: 79  Temp: 99.5 F (37.5 C)  Resp: 16    Physical Exam  Physical Exam  Constitutional: He is oriented to person, place, and time.  Obese male sitting comfortably not in acute distress   Eyes: EOM are normal. Pupils are equal, round, and reactive to light.  Cardiovascular: Normal rate and regular rhythm.  Pulmonary/Chest: Breath sounds normal. No respiratory distress. He has no wheezes. He has no rales.  Musculoskeletal: He exhibits no edema.  Neurological: He is alert and oriented to person, place, and time.    CBC No results found for this basename: wbc,  rbc,  hgb,  hct,  plt,  mcv,  neutrabs,  lymphsabs,  monoabs,  eosabs,  basosabs    CMP     Component Value Date/Time   NA 140 10/01/2013 1507   K 3.9 10/01/2013 1507   CL 105 10/01/2013 1507   CO2 27 10/01/2013 1507   GLUCOSE 121* 10/01/2013 1507   BUN 14 10/01/2013 1507   CREATININE 1.15 10/01/2013 1507   CALCIUM 9.7 10/01/2013 1507    No results found for this basename: chol,  tri,  ldl    No components found with this basename: hga1c    No results found for this basename: AST    Assessment and Plan  DM (diabetes mellitus) - Plan: Results for orders placed in visit on  04/28/14  GLUCOSE, POCT (MANUAL RESULT ENTRY)      Result Value Ref Range   POC Glucose 95  70 - 99 mg/dl   diet controlled, continue with diabetes meal planning.   Glucose (CBG), also check fasting lipid panel , Vit D  25 hydroxy (rtn osteoporosis monitoring), COMPLETE METABOLIC PANEL WITH GFR  Essential hypertension, benign - Plan: carvedilol (COREG) 6.25 MG tablet, lisinopril-hydrochlorothiazide (PRINZIDE,ZESTORETIC) 20-12.5 MG per tablet  Obesity (BMI 30-39.9) Advise for diet and exercise.  Unspecified asthma(493.90) Symptoms Controlled continue with Advair and albuterol when necessary.  Return in about 3 months (around 07/29/2014) for diabetes, hypertension.  Doris Cheadleeepak Lilli Dewald, MD

## 2014-04-28 NOTE — Progress Notes (Signed)
Patient here for follow up on his hypertension and DM

## 2014-04-29 ENCOUNTER — Telehealth: Payer: Self-pay

## 2014-04-29 LAB — VITAMIN D 25 HYDROXY (VIT D DEFICIENCY, FRACTURES): Vit D, 25-Hydroxy: 18 ng/mL — ABNORMAL LOW (ref 30–89)

## 2014-04-29 MED ORDER — VITAMIN D (ERGOCALCIFEROL) 1.25 MG (50000 UNIT) PO CAPS: 50000.0000 [IU] | ORAL_CAPSULE | ORAL | Status: DC

## 2014-04-29 NOTE — Telephone Encounter (Signed)
Patient not available Left message on voice mail to return our call 

## 2014-04-29 NOTE — Telephone Encounter (Signed)
Message copied by Lestine MountJUAREZ, Luddie Boghosian L on Fri Apr 29, 2014  9:19 AM ------      Message from: Doris CheadleADVANI, DEEPAK      Created: Fri Apr 29, 2014  9:17 AM       Blood work reviewed, noticed low vitamin D, call patient advise to start ergocalciferol 50,000 units once a week for the duration of  12 weeks.       ------

## 2014-09-05 ENCOUNTER — Encounter: Payer: Self-pay | Admitting: Internal Medicine

## 2014-09-05 ENCOUNTER — Ambulatory Visit: Payer: Self-pay | Attending: Internal Medicine | Admitting: Internal Medicine

## 2014-09-05 ENCOUNTER — Ambulatory Visit (HOSPITAL_BASED_OUTPATIENT_CLINIC_OR_DEPARTMENT_OTHER): Payer: Self-pay | Admitting: Internal Medicine

## 2014-09-05 VITALS — BP 125/85 | HR 88 | Temp 98.9°F | Resp 16 | Ht 76.0 in | Wt 310.0 lb

## 2014-09-05 DIAGNOSIS — E119 Type 2 diabetes mellitus without complications: Secondary | ICD-10-CM

## 2014-09-05 DIAGNOSIS — I10 Essential (primary) hypertension: Secondary | ICD-10-CM | POA: Insufficient documentation

## 2014-09-05 LAB — POCT GLYCOSYLATED HEMOGLOBIN (HGB A1C): Hemoglobin A1C: 6.3

## 2014-09-05 LAB — GLUCOSE, POCT (MANUAL RESULT ENTRY): POC GLUCOSE: 121 mg/dL — AB (ref 70–99)

## 2014-09-05 NOTE — Progress Notes (Signed)
Patient ID: Ian Cochran, male   DOB: [birth year], 38 y.o.   MRN: [id number]   Ian Cochran, is a 38 y.o. male  [id number]  [id number]  DOB - [birth year]  Chief Complaint  Patient presents with  . Follow-up        Subjective:   Ian Cochran is a 38 y.o. male here today for a follow up on his HTN, DM, and asthma.  PMH significant for HTN, DM, asthma, palpitations, and obesity.  He reports that he continues to have palpitations but they are less frequent and less bothersome since starting carvedilol.  He admits to occasional dietary indiscretions which lead to elevated BP.  He has recently began to watch his diet more closely and is feeling better.  He had no complaints regarding his diabetes.  He feels his asthma is well controlled with his advair and ventolin.  He does not require his rescue inhaler every day.  The patient has no headache, no chest pain, no abdominal pain, no nausea, no new weakness tingling or numbness, no Cough or SOB.  Problem  Essential Hypertension    ALLERGIES: Allergies  Allergen Reactions  . Oxycodone     PAST MEDICAL HISTORY: Past Medical History  Diagnosis Date  . Hypertension   . Asthma   . Palpitation     MEDICATIONS AT HOME: Prior to Admission medications   Medication Sig Start Date End Date Taking? Authorizing Provider  albuterol (PROVENTIL HFA;VENTOLIN HFA) 108 (90 BASE) MCG/ACT inhaler Inhale 2 puffs into the lungs every 6 (six) hours as needed for wheezing. 11/10/13  Yes Quentin Angst, MD  aspirin EC 81 MG tablet Take 1 tablet (81 mg total) by mouth daily. 10/29/13  Yes Leroy Sea, MD  carvedilol (COREG) 6.25 MG tablet Take 1 tablet (6.25 mg total) by mouth 2 (two) times daily with a meal. 04/28/14  Yes Deepak Advani, MD  lisinopril-hydrochlorothiazide (PRINZIDE,ZESTORETIC) 20-12.5 MG per tablet Take 1 tablet by mouth daily. 04/28/14  Yes Deepak Advani, MD  Fluticasone-Salmeterol (ADVAIR) 100-50 MCG/DOSE AEPB Inhale 1 puff  into the lungs 2 (two) times daily. 11/10/13   Quentin Angst, MD  metFORMIN (GLUCOPHAGE) 500 MG tablet Take 1 tablet (500 mg total) by mouth 2 (two) times daily with a meal. 11/01/13   Leroy Sea, MD  Vitamin D, Ergocalciferol, (DRISDOL) 50000 UNITS CAPS capsule Take 1 capsule (50,000 Units total) by mouth every 7 (seven) days. 04/29/14   Doris Cheadle, MD   Review of Systems  Constitutional: Negative.   HENT: Negative.   Eyes: Negative.   Respiratory: Negative.   Cardiovascular: Positive for palpitations. Negative for chest pain, orthopnea, claudication, leg swelling and PND.  Gastrointestinal: Negative.   Genitourinary: Negative.   Musculoskeletal: Negative.   Skin: Negative.   Neurological: Negative.      Objective:   Filed Vitals:   09/05/14 1602  BP: 125/85  Pulse: 88  Temp: 98.9 F (37.2 C)  TempSrc: Oral  Resp: 16  Height:  (1.93 m)  Weight: 310 lb (140.615 kg)  SpO2: 100%    Exam General appearance : Awake, alert, not in any distress. Speech Clear. Not toxic looking HEENT: Atraumatic and Normocephalic, pupils equally reactive to light and accomodation Neck: supple, no JVD. No cervical lymphadenopathy.  Chest:Good air entry bilaterally, no added sounds  CVS: S1 S2 regular, no murmurs.  Abdomen: Bowel sounds present, Non tender and not distended with no gaurding, rigidity or rebound. Extremities: B/L Lower Ext shows no edema,  both legs are warm to touch Neurology: Awake alert, and oriented X 3, CN II-XII intact, Non focal Skin:No Rash Wounds:N/A  Data Review Lab Results  Component Value Date   HGBA1C 6.3 09/05/2014   HGBA1C 6.0 02/16/2014   HGBA1C 6.5* 10/29/2013     Assessment & Plan   1. Type 2 diabetes mellitus without complication  Plan: - Glucose (CBG) - HgB A1c -Discussed low carb/DASH diet and need to increase exercise  2. HTN  Plan: -well controlled with current regimen. Continue carvedilol, lisinopril-HCTZ  3.  Asthma  Plan- Controlled.  Continue Advair and ventolin  4. Vit D deficiency  Plan: Continue Vitamin D weekly   Follow up: 3 months for DM, HTN or as needed  The patient was given clear instructions to go to ER or return to medical center if symptoms don't improve, worsen or new problems develop. The patient verbalized understanding. The patient was told to call to get lab results if they haven't heard anything in the next week.   Evaluation and management procedures were performed by the Advanced Practitioner under my supervision and collaboration. I have reviewed the Advanced Practitioner's note and chart, and I agree with the management and plan.   Jeanann Lewandowsky, MD, MHA, [facility] [place], [place] [phone]

## 2014-09-05 NOTE — Progress Notes (Signed)
Pt is here following up on his HTN, asthma and diabetes.

## 2014-09-05 NOTE — Patient Instructions (Signed)
Diabetes Mellitus and Food It is important for you to manage your blood sugar (glucose) level. Your blood glucose level can be greatly affected by what you eat. Eating healthier foods in the appropriate amounts throughout the day at about the same time each day will help you control your blood glucose level. It can also help slow or prevent worsening of your diabetes mellitus. Healthy eating may even help you improve the level of your blood pressure and reach or maintain a healthy weight.  HOW CAN FOOD AFFECT ME? Carbohydrates Carbohydrates affect your blood glucose level more than any other type of food. Your dietitian will help you determine how many carbohydrates to eat at each meal and teach you how to count carbohydrates. Counting carbohydrates is important to keep your blood glucose at a healthy level, especially if you are using insulin or taking certain medicines for diabetes mellitus. Alcohol Alcohol can cause sudden decreases in blood glucose (hypoglycemia), especially if you use insulin or take certain medicines for diabetes mellitus. Hypoglycemia can be a life-threatening condition. Symptoms of hypoglycemia (sleepiness, dizziness, and disorientation) are similar to symptoms of having too much alcohol.  If your health care provider has given you approval to drink alcohol, do so in moderation and use the following guidelines:  Women should not have more than one drink per day, and men should not have more than two drinks per day. One drink is equal to:  12 oz of beer.  5 oz of wine.  1 oz of hard liquor.  Do not drink on an empty stomach.  Keep yourself hydrated. Have water, diet soda, or unsweetened iced tea.  Regular soda, juice, and other mixers might contain a lot of carbohydrates and should be counted. WHAT FOODS ARE NOT RECOMMENDED? As you make food choices, it is important to remember that all foods are not the same. Some foods have fewer nutrients per serving than other  foods, even though they might have the same number of calories or carbohydrates. It is difficult to get your body what it needs when you eat foods with fewer nutrients. Examples of foods that you should avoid that are high in calories and carbohydrates but low in nutrients include:  Trans fats (most processed foods list trans fats on the Nutrition Facts label).  Regular soda.  Juice.  Candy.  Sweets, such as cake, pie, doughnuts, and cookies.  Fried foods. WHAT FOODS CAN I EAT? Have nutrient-rich foods, which will nourish your body and keep you healthy. The food you should eat also will depend on several factors, including:  The calories you need.  The medicines you take.  Your weight.  Your blood glucose level.  Your blood pressure level.  Your cholesterol level. You also should eat a variety of foods, including:  Protein, such as meat, poultry, fish, tofu, nuts, and seeds (lean animal proteins are best).  Fruits.  Vegetables.  Dairy products, such as milk, cheese, and yogurt (low fat is best).  Breads, grains, pasta, cereal, rice, and beans.  Fats such as olive oil, trans fat-free margarine, canola oil, avocado, and olives. DOES EVERYONE WITH DIABETES MELLITUS HAVE THE SAME MEAL PLAN? Because every person with diabetes mellitus is different, there is not one meal plan that works for everyone. It is very important that you meet with a dietitian who will help you create a meal plan that is just right for you. Document Released: 08/29/2005 Document Revised: 12/07/2013 Document Reviewed: 10/29/2013 ExitCare Patient Information 2015 [employer]. This   information is not intended to replace advice given to you by your health care provider. Make sure you discuss any questions you have with your health care provider. Diabetes and Exercise Exercising regularly is important. It is not just about losing weight. It has many health benefits, such as:  Improving your overall  fitness, flexibility, and endurance.  Increasing your bone density.  Helping with weight control.  Decreasing your body fat.  Increasing your muscle strength.  Reducing stress and tension.  Improving your overall health. People with diabetes who exercise gain additional benefits because exercise:  Reduces appetite.  Improves the body's use of blood sugar (glucose).  Helps lower or control blood glucose.  Decreases blood pressure.  Helps control blood lipids (such as cholesterol and triglycerides).  Improves the body's use of the hormone insulin by:  Increasing the body's insulin sensitivity.  Reducing the body's insulin needs.  Decreases the risk for heart disease because exercising:  Lowers cholesterol and triglycerides levels.  Increases the levels of good cholesterol (such as high-density lipoproteins [HDL]) in the body.  Lowers blood glucose levels. YOUR ACTIVITY PLAN  Choose an activity that you enjoy and set realistic goals. Your health care provider or diabetes educator can help you make an activity plan that works for you. Exercise regularly as directed by your health care provider. This includes:  Performing resistance training twice a week such as push-ups, sit-ups, lifting weights, or using resistance bands.  Performing 150 minutes of cardio exercises each week such as walking, running, or playing sports.  Staying active and spending no more than 90 minutes at one time being inactive. Even short bursts of exercise are good for you. Three 10-minute sessions spread throughout the day are just as beneficial as a single 30-minute session. Some exercise ideas include:  Taking the dog for a walk.  Taking the stairs instead of the elevator.  Dancing to your favorite song.  Doing an exercise video.  Doing your favorite exercise with a friend. RECOMMENDATIONS FOR EXERCISING WITH TYPE 1 OR TYPE 2 DIABETES   Check your blood glucose before exercising. If  blood glucose levels are greater than 240 mg/dL, check for urine ketones. Do not exercise if ketones are present.  Avoid injecting insulin into areas of the body that are going to be exercised. For example, avoid injecting insulin into:  The arms when playing tennis.  The legs when jogging.  Keep a record of:  Food intake before and after you exercise.  Expected peak times of insulin action.  Blood glucose levels before and after you exercise.  The type and amount of exercise you have done.  Review your records with your health care provider. Your health care provider will help you to develop guidelines for adjusting food intake and insulin amounts before and after exercising.  If you take insulin or oral hypoglycemic agents, watch for signs and symptoms of hypoglycemia. They include:  Dizziness.  Shaking.  Sweating.  Chills.  Confusion.  Drink plenty of water while you exercise to prevent dehydration or heat stroke. Body water is lost during exercise and must be replaced.  Talk to your health care provider before starting an exercise program to make sure it is safe for you. Remember, almost any type of activity is better than none. Document Released: 02/22/2004 Document Revised: 04/18/2014 Document Reviewed: 05/11/2013 ExitCare Patient Information 2015 [employer]. This information is not intended to replace advice given to you by your health care provider. Make sure you discuss any   questions you have with your health care provider.  

## 2014-09-14 ENCOUNTER — Ambulatory Visit: Payer: Self-pay

## 2014-09-19 ENCOUNTER — Ambulatory Visit: Payer: Self-pay | Attending: Internal Medicine

## 2014-09-19 DIAGNOSIS — Z23 Encounter for immunization: Secondary | ICD-10-CM

## 2014-09-30 ENCOUNTER — Other Ambulatory Visit: Payer: Self-pay

## 2014-11-24 ENCOUNTER — Encounter: Payer: Self-pay | Admitting: Internal Medicine

## 2014-11-24 ENCOUNTER — Ambulatory Visit: Payer: Self-pay | Attending: Internal Medicine | Admitting: Internal Medicine

## 2014-11-24 VITALS — BP 141/94 | HR 77 | Temp 98.7°F | Resp 16 | Ht 75.0 in | Wt 314.0 lb

## 2014-11-24 DIAGNOSIS — R002 Palpitations: Secondary | ICD-10-CM | POA: Insufficient documentation

## 2014-11-24 DIAGNOSIS — J45909 Unspecified asthma, uncomplicated: Secondary | ICD-10-CM | POA: Insufficient documentation

## 2014-11-24 DIAGNOSIS — Z7982 Long term (current) use of aspirin: Secondary | ICD-10-CM | POA: Insufficient documentation

## 2014-11-24 DIAGNOSIS — E119 Type 2 diabetes mellitus without complications: Secondary | ICD-10-CM | POA: Insufficient documentation

## 2014-11-24 DIAGNOSIS — Z7951 Long term (current) use of inhaled steroids: Secondary | ICD-10-CM | POA: Insufficient documentation

## 2014-11-24 DIAGNOSIS — I10 Essential (primary) hypertension: Secondary | ICD-10-CM | POA: Insufficient documentation

## 2014-11-24 LAB — POCT GLYCOSYLATED HEMOGLOBIN (HGB A1C): HEMOGLOBIN A1C: 6.1

## 2014-11-24 LAB — GLUCOSE, POCT (MANUAL RESULT ENTRY): POC GLUCOSE: 116 mg/dL — AB (ref 70–99)

## 2014-11-24 MED ORDER — LISINOPRIL-HYDROCHLOROTHIAZIDE 20-12.5 MG PO TABS: 1.0000 | ORAL_TABLET | Freq: Every day | ORAL | Status: DC

## 2014-11-24 MED ORDER — CARVEDILOL 6.25 MG PO TABS: 6.2500 mg | ORAL_TABLET | Freq: Two times a day (BID) | ORAL | Status: DC

## 2014-11-24 NOTE — Progress Notes (Signed)
Pt here to f/u with HTN, DM with medication management C/o heart palpitation this past week with taking 1/2 dose Carvedilol  Denies chest pain,sob or dizziness Need refills on Carvedilol CBg- 116, A1c-6.1

## 2014-11-24 NOTE — Progress Notes (Signed)
Patient ID: Terrilee FilesLarry Darnell Cromie, male   DOB: 09-Apr-1976, 38 y.o.   MRN: [id number]   Domenic MorasLarry Kackley, is a 38 y.o. male  [id number]  [id number]  DOB - 09-Apr-1976  Chief Complaint  Patient presents with  . Follow-up  . Diabetes  . Hypertension        Subjective:   Domenic MorasLarry Guzy is a 38 y.o. male here today for a follow up visit. Pt here to f/u with HTN, DM with medication management. He also has palpitation for which he takes carvedilol, he needs a refill. He has no other symptoms today. Patient is compliant with medications, reports no side effects but has not taken blood pressure medication today. Patient has No headache, No chest pain, No abdominal pain - No Nausea, No new weakness tingling or numbness, No Cough - SOB.  No problems updated.  ALLERGIES: Allergies  Allergen Reactions  . Oxycodone     PAST MEDICAL HISTORY: Past Medical History  Diagnosis Date  . Hypertension   . Asthma   . Palpitation     MEDICATIONS AT HOME: Prior to Admission medications   Medication Sig Start Date End Date Taking? Authorizing Provider  albuterol (PROVENTIL HFA;VENTOLIN HFA) 108 (90 BASE) MCG/ACT inhaler Inhale 2 puffs into the lungs every 6 (six) hours as needed for wheezing. 11/10/13  Yes Quentin Angstlugbemiga E Johnchristopher Sarvis, MD  aspirin EC 81 MG tablet Take 1 tablet (81 mg total) by mouth daily. 10/29/13  Yes Leroy SeaPrashant K Singh, MD  carvedilol (COREG) 6.25 MG tablet Take 1 tablet (6.25 mg total) by mouth 2 (two) times daily with a meal. 11/24/14  Yes Robynn Marcel E Hyman HopesJegede, MD  Fluticasone-Salmeterol (ADVAIR) 100-50 MCG/DOSE AEPB Inhale 1 puff into the lungs 2 (two) times daily. 11/10/13  Yes Quentin Angstlugbemiga E Ediberto Sens, MD  lisinopril-hydrochlorothiazide (PRINZIDE,ZESTORETIC) 20-12.5 MG per tablet Take 1 tablet by mouth daily. 11/24/14  Yes Quentin Angstlugbemiga E Jayston Trevino, MD     Objective:   Filed Vitals:   11/24/14 1732  BP: 141/94  Pulse: 77  Temp: 98.7 F (37.1 C)  TempSrc: Oral  Resp: 16  Height: 6\' 3"  (1.905  m)  Weight: 314 lb (142.429 kg)  SpO2: 96%    Exam General appearance : Awake, alert, not in any distress. Speech Clear. Not toxic looking HEENT: Atraumatic and Normocephalic, pupils equally reactive to light and accomodation Neck: supple, no JVD. No cervical lymphadenopathy.  Chest:Good air entry bilaterally, no added sounds  CVS: S1 S2 regular, no murmurs.  Abdomen: Bowel sounds present, Non tender and not distended with no gaurding, rigidity or rebound. Extremities: B/L Lower Ext shows no edema, both legs are warm to touch Neurology: Awake alert, and oriented X 3, CN II-XII intact, Non focal Skin:No Rash Wounds:N/A  Data Review Lab Results  Component Value Date   HGBA1C 6.1 11/24/2014   HGBA1C 6.3 09/05/2014   HGBA1C 6.0 02/16/2014     Assessment & Plan   1. Type 2 diabetes mellitus without complication  - Glucose (CBG) - HgB A1c   Aim for 2-3 Carb Choices per meal (30-45 grams) +/- 1 either way  Aim for 0-15 Carbs per snack if hungry  Include protein in moderation with your meals and snacks  Consider reading food labels for Total Carbohydrate and Fat Grams of foods  Consider checking BG at alternate times per day  Continue taking medication as directed Fruit Punch - find one with no sugar  Measure and decrease portions of carbohydrate foods  Make your plate and don't go back  for seconds   2. Palpitations  - carvedilol (COREG) 6.25 MG tablet; Take 1 tablet (6.25 mg total) by mouth 2 (two) times daily with a meal.  Dispense: 180 tablet; Refill: 3  3. Essential hypertension, benign  - lisinopril-hydrochlorothiazide (PRINZIDE,ZESTORETIC) 20-12.5 MG per tablet; Take 1 tablet by mouth daily.  Dispense: 90 tablet; Refill: 3  - We have discussed target BP range and blood pressure goal - I have advised patient to check BP regularly and to call us back or report to clinic if the numbers are consistently higher than 140/90  - We discussed the importance of compliance  with medical therapy and DASH diet recommended, consequences of uncontrolled hypertension discussed.  - continue current BP medications   Return in about 6 months (around 05/26/2015), or if symptoms worsen or fail to improve, for Follow up HTN, Hemoglobin A1C and Follow up, DM.  The patient was given clear instructions to go to ER or return to medical center if symptoms don't improve, worsen or new problems develop. The patient verbalized understanding. The patient was told to call to get lab results if they haven't heard anything in the next week.   This note has been created with Education officer, environmentalDragon speech recognition software and smart phrase technology. Any transcriptional errors are unintentional.    Jeanann LewandowskyJEGEDE, Nyana Haren, MD, MHA, FACP, FAAP [facility] [place], [place] [phone]   11/24/2014, 5:49 PM

## 2014-11-24 NOTE — Patient Instructions (Signed)
Palpitations A palpitation is the feeling that your heartbeat is irregular or is faster than normal. It may feel like your heart is fluttering or skipping a beat. Palpitations are usually not a serious problem. However, in some cases, you may need further medical evaluation. CAUSES  Palpitations can be caused by:  Smoking.  Caffeine or other stimulants, such as diet pills or energy drinks.  Alcohol.  Stress and anxiety.  Strenuous physical activity.  Fatigue.  Certain medicines.  Heart disease, especially if you have a history of irregular heart rhythms (arrhythmias), such as atrial fibrillation, atrial flutter, or supraventricular tachycardia.  An improperly working pacemaker or defibrillator. DIAGNOSIS  To find the cause of your palpitations, your health care provider will take your medical history and perform a physical exam. Your health care provider may also have you take a test called an ambulatory electrocardiogram (ECG). An ECG records your heartbeat patterns over a 24-hour period. You may also have other tests, such as:  Transthoracic echocardiogram (TTE). During echocardiography, sound waves are used to evaluate how blood flows through your heart.  Transesophageal echocardiogram (TEE).  Cardiac monitoring. This allows your health care provider to monitor your heart rate and rhythm in real time.  Holter monitor. This is a portable device that records your heartbeat and can help diagnose heart arrhythmias. It allows your health care provider to track your heart activity for several days, if needed.  Stress tests by exercise or by giving medicine that makes the heart beat faster. TREATMENT  Treatment of palpitations depends on the cause of your symptoms and can vary greatly. Most cases of palpitations do not require any treatment other than time, relaxation, and monitoring your symptoms. Other causes, such as atrial fibrillation, atrial flutter, or supraventricular  tachycardia, usually require further treatment. HOME CARE INSTRUCTIONS   Avoid:  Caffeinated coffee, tea, soft drinks, diet pills, and energy drinks.  Chocolate.  Alcohol.  Stop smoking if you smoke.  Reduce your stress and anxiety. Things that can help you relax include:  A method of controlling things in your body, such as your heartbeats, with your mind (biofeedback).  Yoga.  Meditation.  Physical activity such as swimming, jogging, or walking.  Get plenty of rest and sleep. SEEK MEDICAL CARE IF:   You continue to have a fast or irregular heartbeat beyond 24 hours.  Your palpitations occur more often. SEEK IMMEDIATE MEDICAL CARE IF:  You have chest pain or shortness of breath.  You have a severe headache.  You feel dizzy or you faint. MAKE SURE YOU:  Understand these instructions.  Will watch your condition.  Will get help right away if you are not doing well or get worse. Document Released: 11/29/2000 Document Revised: 12/07/2013 Document Reviewed: 01/31/2012 [facility] Patient Information 2015 [place], [place]. This information is not intended to replace advice given to you by your health care provider. Make sure you discuss any questions you have with your health care provider. Hypertension Hypertension, commonly called high blood pressure, is when the force of blood pumping through your arteries is too strong. Your arteries are the blood vessels that carry blood from your heart throughout your body. A blood pressure reading consists of a higher number over a lower number, such as 110/72. The higher number (systolic) is the pressure inside your arteries when your heart pumps. The lower number (diastolic) is the pressure inside your arteries when your heart relaxes. Ideally you want your blood pressure below 120/80. Hypertension forces your heart to work harder  to pump blood. Your arteries may become narrow or stiff. Having hypertension puts you at risk for heart  disease, stroke, and other problems.  RISK FACTORS Some risk factors for high blood pressure are controllable. Others are not.  Risk factors you cannot control include:   Race. You may be at higher risk if you are African American.  Age. Risk increases with age.  Gender. Men are at higher risk than women before age 82 years. After age 46, women are at higher risk than men. Risk factors you can control include:  Not getting enough exercise or physical activity.  Being overweight.  Getting too much fat, sugar, calories, or salt in your diet.  Drinking too much alcohol. SIGNS AND SYMPTOMS Hypertension does not usually cause signs or symptoms. Extremely high blood pressure (hypertensive crisis) may cause headache, anxiety, shortness of breath, and nosebleed. DIAGNOSIS  To check if you have hypertension, your health care provider will measure your blood pressure while you are seated, with your arm held at the level of your heart. It should be measured at least twice using the same arm. Certain conditions can cause a difference in blood pressure between your right and left arms. A blood pressure reading that is higher than normal on one occasion does not mean that you need treatment. If one blood pressure reading is high, ask your health care provider about having it checked again. TREATMENT  Treating high blood pressure includes making lifestyle changes and possibly taking medicine. Living a healthy lifestyle can help lower high blood pressure. You may need to change some of your habits. Lifestyle changes may include:  Following the DASH diet. This diet is high in fruits, vegetables, and whole grains. It is low in salt, red meat, and added sugars.  Getting at least 2 hours of brisk physical activity every week.  Losing weight if necessary.  Not smoking.  Limiting alcoholic beverages.  Learning ways to reduce stress. If lifestyle changes are not enough to get your blood pressure  under control, your health care provider may prescribe medicine. You may need to take more than one. Work closely with your health care provider to understand the risks and benefits. HOME CARE INSTRUCTIONS  Have your blood pressure rechecked as directed by your health care provider.   Take medicines only as directed by your health care provider. Follow the directions carefully. Blood pressure medicines must be taken as prescribed. The medicine does not work as well when you skip doses. Skipping doses also puts you at risk for problems.   Do not smoke.   Monitor your blood pressure at home as directed by your health care provider. SEEK MEDICAL CARE IF:   You think you are having a reaction to medicines taken.  You have recurrent headaches or feel dizzy.  You have swelling in your ankles.  You have trouble with your vision. SEEK IMMEDIATE MEDICAL CARE IF:  You develop a severe headache or confusion.  You have unusual weakness, numbness, or feel faint.  You have severe chest or abdominal pain.  You vomit repeatedly.  You have trouble breathing. MAKE SURE YOU:   Understand these instructions.  Will watch your condition.  Will get help right away if you are not doing well or get worse. Document Released: 12/02/2005 Document Revised: 04/18/2014 Document Reviewed: 09/24/2013 [facility] Patient Information 2015 [place], [place]. This information is not intended to replace advice given to you by your health care provider. Make sure you discuss any questions you have  with your health care provider. Diabetes and Exercise Exercising regularly is important. It is not just about losing weight. It has many health benefits, such as:  Improving your overall fitness, flexibility, and endurance.  Increasing your bone density.  Helping with weight control.  Decreasing your body fat.  Increasing your muscle strength.  Reducing stress and tension.  Improving your overall  health. People with diabetes who exercise gain additional benefits because exercise:  Reduces appetite.  Improves the body's use of blood sugar (glucose).  Helps lower or control blood glucose.  Decreases blood pressure.  Helps control blood lipids (such as cholesterol and triglycerides).  Improves the body's use of the hormone insulin by:  Increasing the body's insulin sensitivity.  Reducing the body's insulin needs.  Decreases the risk for heart disease because exercising:  Lowers cholesterol and triglycerides levels.  Increases the levels of good cholesterol (such as high-density lipoproteins [HDL]) in the body.  Lowers blood glucose levels. YOUR ACTIVITY PLAN  Choose an activity that you enjoy and set realistic goals. Your health care provider or diabetes educator can help you make an activity plan that works for you. Exercise regularly as directed by your health care provider. This includes:  Performing resistance training twice a week such as push-ups, sit-ups, lifting weights, or using resistance bands.  Performing 150 minutes of cardio exercises each week such as walking, running, or playing sports.  Staying active and spending no more than 90 minutes at one time being inactive. Even short bursts of exercise are good for you. Three 10-minute sessions spread throughout the day are just as beneficial as a single 30-minute session. Some exercise ideas include:  Taking the dog for a walk.  Taking the stairs instead of the elevator.  Dancing to your favorite song.  Doing an exercise video.  Doing your favorite exercise with a friend. RECOMMENDATIONS FOR EXERCISING WITH TYPE 1 OR TYPE 2 DIABETES   Check your blood glucose before exercising. If blood glucose levels are greater than 240 mg/dL, check for urine ketones. Do not exercise if ketones are present.  Avoid injecting insulin into areas of the body that are going to be exercised. For example, avoid injecting  insulin into:  The arms when playing tennis.  The legs when jogging.  Keep a record of:  Food intake before and after you exercise.  Expected peak times of insulin action.  Blood glucose levels before and after you exercise.  The type and amount of exercise you have done.  Review your records with your health care provider. Your health care provider will help you to develop guidelines for adjusting food intake and insulin amounts before and after exercising.  If you take insulin or oral hypoglycemic agents, watch for signs and symptoms of hypoglycemia. They include:  Dizziness.  Shaking.  Sweating.  Chills.  Confusion.  Drink plenty of water while you exercise to prevent dehydration or heat stroke. Body water is lost during exercise and must be replaced.  Talk to your health care provider before starting an exercise program to make sure it is safe for you. Remember, almost any type of activity is better than none. Document Released: 02/22/2004 Document Revised: 04/18/2014 Document Reviewed: 05/11/2013 [facility] Patient Information 2015 [place], [place]. This information is not intended to replace advice given to you by your health care provider. Make sure you discuss any questions you have with your health care provider. Basic Carbohydrate Counting for Diabetes Mellitus Carbohydrate counting is a method for keeping track of  the amount of carbohydrates you eat. Eating carbohydrates naturally increases the level of sugar (glucose) in your blood, so it is important for you to know the amount that is okay for you to have in every meal. Carbohydrate counting helps keep the level of glucose in your blood within normal limits. The amount of carbohydrates allowed is different for every person. A dietitian can help you calculate the amount that is right for you. Once you know the amount of carbohydrates you can have, you can count the carbohydrates in the foods you want to  eat. Carbohydrates are found in the following foods:  Grains, such as breads and cereals.  Dried beans and soy products.  Starchy vegetables, such as potatoes, peas, and corn.  Fruit and fruit juices.  Milk and yogurt.  Sweets and snack foods, such as cake, cookies, candy, chips, soft drinks, and fruit drinks. CARBOHYDRATE COUNTING There are two ways to count the carbohydrates in your food. You can use either of the methods or a combination of both. Reading the "Nutrition Facts" on Packaged Food The "Nutrition Facts" is an area that is included on the labels of almost all packaged food and beverages in the [place]. It includes the serving size of that food or beverage and information about the nutrients in each serving of the food, including the grams (g) of carbohydrate per serving.  Decide the number of servings of this food or beverage that you will be able to eat or drink. Multiply that number of servings by the number of grams of carbohydrate that is listed on the label for that serving. The total will be the amount of carbohydrates you will be having when you eat or drink this food or beverage. Learning Standard Serving Sizes of Food When you eat food that is not packaged or does not include "Nutrition Facts" on the label, you need to measure the servings in order to count the amount of carbohydrates.A serving of most carbohydrate-rich foods contains about 15 g of carbohydrates. The following list includes serving sizes of carbohydrate-rich foods that provide 15 g ofcarbohydrate per serving:   1 slice of bread (1 oz) or 1 six-inch tortilla.    of a hamburger bun or English muffin.  4-6 crackers.   cup unsweetened dry cereal.    cup hot cereal.   cup rice or pasta.    cup mashed potatoes or  of a large baked potato.  1 cup fresh fruit or one small piece of fruit.    cup canned or frozen fruit or fruit juice.  1 cup milk.   cup plain fat-free yogurt or  yogurt sweetened with artificial sweeteners.   cup cooked dried beans or starchy vegetable, such as peas, corn, or potatoes.  Decide the number of standard-size servings that you will eat. Multiply that number of servings by 15 (the grams of carbohydrates in that serving). For example, if you eat 2 cups of strawberries, you will have eaten 2 servings and 30 g of carbohydrates (2 servings x 15 g = 30 g). For foods such as soups and casseroles, in which more than one food is mixed in, you will need to count the carbohydrates in each food that is included. EXAMPLE OF CARBOHYDRATE COUNTING Sample Dinner  3 oz chicken breast.   cup of brown rice.   cup of corn.  1 cup milk.   1 cup strawberries with sugar-free whipped topping.  Carbohydrate Calculation Step 1: Identify the foods that contain carbohydrates:  Rice.   Corn.   Milk.   Strawberries. Step 2:Calculate the number of servings eaten of each:   2 servings of rice.   1 serving of corn.   1 serving of milk.   1 serving of strawberries. Step 3: Multiply each of those number of servings by 15 g:   2 servings of rice x 15 g = 30 g.   1 serving of corn x 15 g = 15 g.   1 serving of milk x 15 g = 15 g.   1 serving of strawberries x 15 g = 15 g. Step 4: Add together all of the amounts to find the total grams of carbohydrates eaten: 30 g + 15 g + 15 g + 15 g = 75 g. Document Released: 12/02/2005 Document Revised: 04/18/2014 Document Reviewed: 10/29/2013 [facility] Patient Information 2015 [place], [place]. This information is not intended to replace advice given to you by your health care provider. Make sure you discuss any questions you have with your health care provider.

## 2015-01-19 ENCOUNTER — Other Ambulatory Visit: Payer: Self-pay | Admitting: *Deleted

## 2015-01-19 ENCOUNTER — Other Ambulatory Visit: Payer: Self-pay | Admitting: Internal Medicine

## 2015-01-19 MED ORDER — FLUTICASONE-SALMETEROL 100-50 MCG/DOSE IN AEPB: 1.0000 | INHALATION_SPRAY | Freq: Two times a day (BID) | RESPIRATORY_TRACT | Status: DC

## 2015-01-19 MED ORDER — ALBUTEROL SULFATE HFA 108 (90 BASE) MCG/ACT IN AERS: 2.0000 | INHALATION_SPRAY | Freq: Four times a day (QID) | RESPIRATORY_TRACT | Status: DC | PRN

## 2015-01-19 NOTE — Telephone Encounter (Signed)
Pt requesting Albuterol Inj and Ventolin Inh refill Refills send to [facility]

## 2015-01-20 ENCOUNTER — Other Ambulatory Visit: Payer: Self-pay | Admitting: Internal Medicine

## 2015-01-20 ENCOUNTER — Other Ambulatory Visit: Payer: Self-pay | Admitting: *Deleted

## 2015-01-20 MED ORDER — ALBUTEROL SULFATE HFA 108 (90 BASE) MCG/ACT IN AERS: 2.0000 | INHALATION_SPRAY | Freq: Four times a day (QID) | RESPIRATORY_TRACT | Status: DC | PRN

## 2015-03-20 ENCOUNTER — Ambulatory Visit: Payer: Self-pay | Attending: Internal Medicine

## 2015-04-06 ENCOUNTER — Other Ambulatory Visit: Payer: Self-pay | Admitting: Internal Medicine

## 2015-04-06 MED ORDER — FLUTICASONE-SALMETEROL 100-50 MCG/DOSE IN AEPB: 1.0000 | INHALATION_SPRAY | Freq: Two times a day (BID) | RESPIRATORY_TRACT | Status: DC

## 2015-04-06 MED ORDER — ALBUTEROL SULFATE HFA 108 (90 BASE) MCG/ACT IN AERS: 2.0000 | INHALATION_SPRAY | Freq: Four times a day (QID) | RESPIRATORY_TRACT | Status: DC | PRN

## 2015-04-07 ENCOUNTER — Other Ambulatory Visit: Payer: Self-pay | Admitting: Internal Medicine

## 2015-04-07 MED ORDER — FLUTICASONE-SALMETEROL 100-50 MCG/DOSE IN AEPB: 1.0000 | INHALATION_SPRAY | Freq: Two times a day (BID) | RESPIRATORY_TRACT | Status: DC

## 2015-04-07 MED ORDER — ALBUTEROL SULFATE HFA 108 (90 BASE) MCG/ACT IN AERS: 2.0000 | INHALATION_SPRAY | Freq: Four times a day (QID) | RESPIRATORY_TRACT | Status: DC | PRN

## 2015-04-17 ENCOUNTER — Other Ambulatory Visit: Payer: Self-pay | Admitting: Internal Medicine

## 2015-04-17 ENCOUNTER — Telehealth: Payer: Self-pay | Admitting: Internal Medicine

## 2015-04-17 NOTE — Telephone Encounter (Signed)
Patient is calling to request a med refill for his Albuterol inhaler, please f/u with pt.

## 2015-05-04 ENCOUNTER — Ambulatory Visit: Payer: Self-pay | Admitting: Internal Medicine

## 2015-05-29 ENCOUNTER — Other Ambulatory Visit: Payer: Self-pay

## 2015-05-29 ENCOUNTER — Ambulatory Visit: Payer: Self-pay | Attending: Internal Medicine | Admitting: Internal Medicine

## 2015-05-29 ENCOUNTER — Encounter: Payer: Self-pay | Admitting: Internal Medicine

## 2015-05-29 VITALS — BP 146/91 | HR 72 | Temp 98.5°F | Ht 76.0 in | Wt 327.8 lb

## 2015-05-29 DIAGNOSIS — R002 Palpitations: Secondary | ICD-10-CM | POA: Insufficient documentation

## 2015-05-29 DIAGNOSIS — J4540 Moderate persistent asthma, uncomplicated: Secondary | ICD-10-CM | POA: Insufficient documentation

## 2015-05-29 DIAGNOSIS — J45909 Unspecified asthma, uncomplicated: Secondary | ICD-10-CM | POA: Insufficient documentation

## 2015-05-29 DIAGNOSIS — E119 Type 2 diabetes mellitus without complications: Secondary | ICD-10-CM

## 2015-05-29 DIAGNOSIS — G4733 Obstructive sleep apnea (adult) (pediatric): Secondary | ICD-10-CM | POA: Insufficient documentation

## 2015-05-29 DIAGNOSIS — E1165 Type 2 diabetes mellitus with hyperglycemia: Secondary | ICD-10-CM | POA: Insufficient documentation

## 2015-05-29 DIAGNOSIS — I10 Essential (primary) hypertension: Secondary | ICD-10-CM | POA: Insufficient documentation

## 2015-05-29 LAB — COMPLETE METABOLIC PANEL WITH GFR
ALK PHOS: 36 U/L — AB (ref 39–117)
ALT: 13 U/L (ref 0–53)
AST: 12 U/L (ref 0–37)
Albumin: 4.3 g/dL (ref 3.5–5.2)
BILIRUBIN TOTAL: 0.2 mg/dL (ref 0.2–1.2)
BUN: 16 mg/dL (ref 6–23)
CO2: 29 mEq/L (ref 19–32)
CREATININE: 1.11 mg/dL (ref 0.50–1.35)
Calcium: 9.8 mg/dL (ref 8.4–10.5)
Chloride: 104 mEq/L (ref 96–112)
GFR, EST NON AFRICAN AMERICAN: 83 mL/min
GLUCOSE: 104 mg/dL — AB (ref 70–99)
Potassium: 4.5 mEq/L (ref 3.5–5.3)
Sodium: 140 mEq/L (ref 135–145)
Total Protein: 6.9 g/dL (ref 6.0–8.3)

## 2015-05-29 LAB — LIPID PANEL
Cholesterol: 185 mg/dL (ref 0–200)
HDL: 33 mg/dL — ABNORMAL LOW (ref >=40)
LDL CALC: 128 mg/dL — AB (ref 0–99)
TRIGLYCERIDES: 118 mg/dL (ref <150)
Total CHOL/HDL Ratio: 5.6 Ratio
VLDL: 24 mg/dL (ref 0–40)

## 2015-05-29 LAB — GLUCOSE, POCT (MANUAL RESULT ENTRY): POC Glucose: 113 mg/dl — AB (ref 70–99)

## 2015-05-29 LAB — POCT GLYCOSYLATED HEMOGLOBIN (HGB A1C): Hemoglobin A1C: 6.5

## 2015-05-29 LAB — TSH: TSH: 0.889 u[IU]/mL (ref 0.350–4.500)

## 2015-05-29 MED ORDER — ALBUTEROL SULFATE HFA 108 (90 BASE) MCG/ACT IN AERS: 2.0000 | INHALATION_SPRAY | Freq: Four times a day (QID) | RESPIRATORY_TRACT | Status: DC | PRN

## 2015-05-29 MED ORDER — CARVEDILOL 6.25 MG PO TABS: 6.2500 mg | ORAL_TABLET | Freq: Two times a day (BID) | ORAL | Status: DC

## 2015-05-29 MED ORDER — LISINOPRIL-HYDROCHLOROTHIAZIDE 20-12.5 MG PO TABS: 1.0000 | ORAL_TABLET | Freq: Every day | ORAL | Status: DC

## 2015-05-29 MED ORDER — FLUTICASONE-SALMETEROL 100-50 MCG/DOSE IN AEPB: 1.0000 | INHALATION_SPRAY | Freq: Two times a day (BID) | RESPIRATORY_TRACT | Status: DC

## 2015-05-29 NOTE — Patient Instructions (Signed)
Diabetes and Exercise Exercising regularly is important. It is not just about losing weight. It has many health benefits, such as:  Improving your overall fitness, flexibility, and endurance.  Increasing your bone density.  Helping with weight control.  Decreasing your body fat.  Increasing your muscle strength.  Reducing stress and tension.  Improving your overall health. People with diabetes who exercise gain additional benefits because exercise:  Reduces appetite.  Improves the body's use of blood sugar (glucose).  Helps lower or control blood glucose.  Decreases blood pressure.  Helps control blood lipids (such as cholesterol and triglycerides).  Improves the body's use of the hormone insulin by:  Increasing the body's insulin sensitivity.  Reducing the body's insulin needs.  Decreases the risk for heart disease because exercising:  Lowers cholesterol and triglycerides levels.  Increases the levels of good cholesterol (such as high-density lipoproteins [HDL]) in the body.  Lowers blood glucose levels. YOUR ACTIVITY PLAN  Choose an activity that you enjoy and set realistic goals. Your health care provider or diabetes educator can help you make an activity plan that works for you. Exercise regularly as directed by your health care provider. This includes:  Performing resistance training twice a week such as push-ups, sit-ups, lifting weights, or using resistance bands.  Performing 150 minutes of cardio exercises each week such as walking, running, or playing sports.  Staying active and spending no more than 90 minutes at one time being inactive. Even short bursts of exercise are good for you. Three 10-minute sessions spread throughout the day are just as beneficial as a single 30-minute session. Some exercise ideas include:  Taking the dog for a walk.  Taking the stairs instead of the elevator.  Dancing to your favorite song.  Doing an exercise  video.  Doing your favorite exercise with a friend. RECOMMENDATIONS FOR EXERCISING WITH TYPE 1 OR TYPE 2 DIABETES   Check your blood glucose before exercising. If blood glucose levels are greater than 240 mg/dL, check for urine ketones. Do not exercise if ketones are present.  Avoid injecting insulin into areas of the body that are going to be exercised. For example, avoid injecting insulin into:  The arms when playing tennis.  The legs when jogging.  Keep a record of:  Food intake before and after you exercise.  Expected peak times of insulin action.  Blood glucose levels before and after you exercise.  The type and amount of exercise you have done.  Review your records with your health care provider. Your health care provider will help you to develop guidelines for adjusting food intake and insulin amounts before and after exercising.  If you take insulin or oral hypoglycemic agents, watch for signs and symptoms of hypoglycemia. They include:  Dizziness.  Shaking.  Sweating.  Chills.  Confusion.  Drink plenty of water while you exercise to prevent dehydration or heat stroke. Body water is lost during exercise and must be replaced.  Talk to your health care provider before starting an exercise program to make sure it is safe for you. Remember, almost any type of activity is better than none. Document Released: 02/22/2004 Document Revised: 04/18/2014 Document Reviewed: 05/11/2013 ExitCare Patient Information 2015 [employer]. This information is not intended to replace advice given to you by your health care provider. Make sure you discuss any questions you have with your health care provider. Basic Carbohydrate Counting for Diabetes Mellitus Carbohydrate counting is a method for keeping track of the amount of carbohydrates you eat.   Eating carbohydrates naturally increases the level of sugar (glucose) in your blood, so it is important for you to know the amount that is  okay for you to have in every meal. Carbohydrate counting helps keep the level of glucose in your blood within normal limits. The amount of carbohydrates allowed is different for every person. A dietitian can help you calculate the amount that is right for you. Once you know the amount of carbohydrates you can have, you can count the carbohydrates in the foods you want to eat. Carbohydrates are found in the following foods:  Grains, such as breads and cereals.  Dried beans and soy products.  Starchy vegetables, such as potatoes, peas, and corn.  Fruit and fruit juices.  Milk and yogurt.  Sweets and snack foods, such as cake, cookies, candy, chips, soft drinks, and fruit drinks. CARBOHYDRATE COUNTING There are two ways to count the carbohydrates in your food. You can use either of the methods or a combination of both. Reading the "Nutrition Facts" on Packaged Food The "Nutrition Facts" is an area that is included on the labels of almost all packaged food and beverages in the [place]. It includes the serving size of that food or beverage and information about the nutrients in each serving of the food, including the grams (g) of carbohydrate per serving.  Decide the number of servings of this food or beverage that you will be able to eat or drink. Multiply that number of servings by the number of grams of carbohydrate that is listed on the label for that serving. The total will be the amount of carbohydrates you will be having when you eat or drink this food or beverage. Learning Standard Serving Sizes of Food When you eat food that is not packaged or does not include "Nutrition Facts" on the label, you need to measure the servings in order to count the amount of carbohydrates.A serving of most carbohydrate-rich foods contains about 15 g of carbohydrates. The following list includes serving sizes of carbohydrate-rich foods that provide 15 g ofcarbohydrate per serving:   1 slice of bread  (1 oz) or 1 six-inch tortilla.    of a hamburger bun or English muffin.  4-6 crackers.   cup unsweetened dry cereal.    cup hot cereal.   cup rice or pasta.    cup mashed potatoes or  of a large baked potato.  1 cup fresh fruit or one small piece of fruit.    cup canned or frozen fruit or fruit juice.  1 cup milk.   cup plain fat-free yogurt or yogurt sweetened with artificial sweeteners.   cup cooked dried beans or starchy vegetable, such as peas, corn, or potatoes.  Decide the number of standard-size servings that you will eat. Multiply that number of servings by 15 (the grams of carbohydrates in that serving). For example, if you eat 2 cups of strawberries, you will have eaten 2 servings and 30 g of carbohydrates (2 servings x 15 g = 30 g). For foods such as soups and casseroles, in which more than one food is mixed in, you will need to count the carbohydrates in each food that is included. EXAMPLE OF CARBOHYDRATE COUNTING Sample Dinner  3 oz chicken breast.   cup of brown rice.   cup of corn.  1 cup milk.   1 cup strawberries with sugar-free whipped topping.  Carbohydrate Calculation Step 1: Identify the foods that contain carbohydrates:   Rice.   Corn.     Milk.   Strawberries. Step 2:Calculate the number of servings eaten of each:   2 servings of rice.   1 serving of corn.   1 serving of milk.   1 serving of strawberries. Step 3: Multiply each of those number of servings by 15 g:   2 servings of rice x 15 g = 30 g.   1 serving of corn x 15 g = 15 g.   1 serving of milk x 15 g = 15 g.   1 serving of strawberries x 15 g = 15 g. Step 4: Add together all of the amounts to find the total grams of carbohydrates eaten: 30 g + 15 g + 15 g + 15 g = 75 g. Document Released: 12/02/2005 Document Revised: 04/18/2014 Document Reviewed: 10/29/2013 ExitCare Patient Information 2015 [employer]. This information is not intended to  replace advice given to you by your health care provider. Make sure you discuss any questions you have with your health care provider. DASH Eating Plan DASH stands for "Dietary Approaches to Stop Hypertension." The DASH eating plan is a healthy eating plan that has been shown to reduce high blood pressure (hypertension). Additional health benefits may include reducing the risk of type 2 diabetes mellitus, heart disease, and stroke. The DASH eating plan may also help with weight loss. WHAT DO I NEED TO KNOW ABOUT THE DASH EATING PLAN? For the DASH eating plan, you will follow these general guidelines:  Choose foods with a percent daily value for sodium of less than 5% (as listed on the food label).  Use salt-free seasonings or herbs instead of table salt or sea salt.  Check with your health care provider or pharmacist before using salt substitutes.  Eat lower-sodium products, often labeled as "lower sodium" or "no salt added."  Eat fresh foods.  Eat more vegetables, fruits, and low-fat dairy products.  Choose whole grains. Look for the word "whole" as the first word in the ingredient list.  Choose fish and skinless chicken or turkey more often than red meat. Limit fish, poultry, and meat to 6 oz (170 g) each day.  Limit sweets, desserts, sugars, and sugary drinks.  Choose heart-healthy fats.  Limit cheese to 1 oz (28 g) per day.  Eat more home-cooked food and less restaurant, buffet, and fast food.  Limit fried foods.  Cook foods using methods other than frying.  Limit canned vegetables. If you do use them, rinse them well to decrease the sodium.  When eating at a restaurant, ask that your food be prepared with less salt, or no salt if possible. WHAT FOODS CAN I EAT? Seek help from a dietitian for individual calorie needs. Grains Whole grain or whole wheat bread. Brown rice. Whole grain or whole wheat pasta. Quinoa, bulgur, and whole grain cereals. Low-sodium cereals. Corn or  whole wheat flour tortillas. Whole grain cornbread. Whole grain crackers. Low-sodium crackers. Vegetables Fresh or frozen vegetables (raw, steamed, roasted, or grilled). Low-sodium or reduced-sodium tomato and vegetable juices. Low-sodium or reduced-sodium tomato sauce and paste. Low-sodium or reduced-sodium canned vegetables.  Fruits All fresh, canned (in natural juice), or frozen fruits. Meat and Other Protein Products Ground beef (85% or leaner), grass-fed beef, or beef trimmed of fat. Skinless chicken or turkey. Ground chicken or turkey. Pork trimmed of fat. All fish and seafood. Eggs. Dried beans, peas, or lentils. Unsalted nuts and seeds. Unsalted canned beans. Dairy Low-fat dairy products, such as skim or 1% milk, 2% or reduced-fat cheeses, low-fat   ricotta or cottage cheese, or plain low-fat yogurt. Low-sodium or reduced-sodium cheeses. Fats and Oils Tub margarines without trans fats. Light or reduced-fat mayonnaise and salad dressings (reduced sodium). Avocado. Safflower, olive, or canola oils. Natural peanut or almond butter. Other Unsalted popcorn and pretzels. The items listed above may not be a complete list of recommended foods or beverages. Contact your dietitian for more options. WHAT FOODS ARE NOT RECOMMENDED? Grains White bread. White pasta. White rice. Refined cornbread. Bagels and croissants. Crackers that contain trans fat. Vegetables Creamed or fried vegetables. Vegetables in a cheese sauce. Regular canned vegetables. Regular canned tomato sauce and paste. Regular tomato and vegetable juices. Fruits Dried fruits. Canned fruit in light or heavy syrup. Fruit juice. Meat and Other Protein Products Fatty cuts of meat. Ribs, chicken wings, bacon, sausage, bologna, salami, chitterlings, fatback, hot dogs, bratwurst, and packaged luncheon meats. Salted nuts and seeds. Canned beans with salt. Dairy Whole or 2% milk, cream, half-and-half, and cream cheese. Whole-fat or sweetened  yogurt. Full-fat cheeses or blue cheese. Nondairy creamers and whipped toppings. Processed cheese, cheese spreads, or cheese curds. Condiments Onion and garlic salt, seasoned salt, table salt, and sea salt. Canned and packaged gravies. Worcestershire sauce. Tartar sauce. Barbecue sauce. Teriyaki sauce. Soy sauce, including reduced sodium. Steak sauce. Fish sauce. Oyster sauce. Cocktail sauce. Horseradish. Ketchup and mustard. Meat flavorings and tenderizers. Bouillon cubes. Hot sauce. Tabasco sauce. Marinades. Taco seasonings. Relishes. Fats and Oils Butter, stick margarine, lard, shortening, ghee, and bacon fat. Coconut, palm kernel, or palm oils. Regular salad dressings. Other Pickles and olives. Salted popcorn and pretzels. The items listed above may not be a complete list of foods and beverages to avoid. Contact your dietitian for more information. WHERE CAN I FIND MORE INFORMATION? National Heart, Lung, and [employer] Institute: www.nhlbi.nih.gov/health/health-topics/topics/dash/ Document Released: 11/21/2011 Document Revised: 04/18/2014 Document Reviewed: 10/06/2013 ExitCare Patient Information 2015 [employer]. This information is not intended to replace advice given to you by your health care provider. Make sure you discuss any questions you have with your health care provider. Hypertension Hypertension, commonly called high blood pressure, is when the force of blood pumping through your arteries is too strong. Your arteries are the blood vessels that carry blood from your heart throughout your body. A blood pressure reading consists of a higher number over a lower number, such as 110/72. The higher number (systolic) is the pressure inside your arteries when your heart pumps. The lower number (diastolic) is the pressure inside your arteries when your heart relaxes. Ideally you want your blood pressure below 120/80. Hypertension forces your heart to work harder to pump blood. Your arteries may  become narrow or stiff. Having hypertension puts you at risk for heart disease, stroke, and other problems.  RISK FACTORS Some risk factors for high blood pressure are controllable. Others are not.  Risk factors you cannot control include:   Race. You may be at higher risk if you are African American.  Age. Risk increases with age.  Gender. Men are at higher risk than women before age 45 years. After age 65, women are at higher risk than men. Risk factors you can control include:  Not getting enough exercise or physical activity.  Being overweight.  Getting too much fat, sugar, calories, or salt in your diet.  Drinking too much alcohol. SIGNS AND SYMPTOMS Hypertension does not usually cause signs or symptoms. Extremely high blood pressure (hypertensive crisis) may cause headache, anxiety, shortness of breath, and nosebleed. DIAGNOSIS  To check if   you have hypertension, your health care provider will measure your blood pressure while you are seated, with your arm held at the level of your heart. It should be measured at least twice using the same arm. Certain conditions can cause a difference in blood pressure between your right and left arms. A blood pressure reading that is higher than normal on one occasion does not mean that you need treatment. If one blood pressure reading is high, ask your health care provider about having it checked again. TREATMENT  Treating high blood pressure includes making lifestyle changes and possibly taking medicine. Living a healthy lifestyle can help lower high blood pressure. You may need to change some of your habits. Lifestyle changes may include:  Following the DASH diet. This diet is high in fruits, vegetables, and whole grains. It is low in salt, red meat, and added sugars.  Getting at least 2 hours of brisk physical activity every week.  Losing weight if necessary.  Not smoking.  Limiting alcoholic beverages.  Learning ways to reduce  stress. If lifestyle changes are not enough to get your blood pressure under control, your health care provider may prescribe medicine. You may need to take more than one. Work closely with your health care provider to understand the risks and benefits. HOME CARE INSTRUCTIONS  Have your blood pressure rechecked as directed by your health care provider.   Take medicines only as directed by your health care provider. Follow the directions carefully. Blood pressure medicines must be taken as prescribed. The medicine does not work as well when you skip doses. Skipping doses also puts you at risk for problems.   Do not smoke.   Monitor your blood pressure at home as directed by your health care provider. SEEK MEDICAL CARE IF:   You think you are having a reaction to medicines taken.  You have recurrent headaches or feel dizzy.  You have swelling in your ankles.  You have trouble with your vision. SEEK IMMEDIATE MEDICAL CARE IF:  You develop a severe headache or confusion.  You have unusual weakness, numbness, or feel faint.  You have severe chest or abdominal pain.  You vomit repeatedly.  You have trouble breathing. MAKE SURE YOU:   Understand these instructions.  Will watch your condition.  Will get help right away if you are not doing well or get worse. Document Released: 12/02/2005 Document Revised: 04/18/2014 Document Reviewed: 09/24/2013 ExitCare Patient Information 2015 [employer]. This information is not intended to replace advice given to you by your health care provider. Make sure you discuss any questions you have with your health care provider.  

## 2015-05-29 NOTE — Progress Notes (Signed)
Subjective:     Patient ID: Ian Cochran, male   DOB: January 25, 1976, 39 y.o.   MRN: [id number]  HPI  [name] is a 39 yo African American male with BMI of 39.8 and history of asthma, palpitations, essential hypertension, and type 2 diabetes here today for a follow up visit.   Patient reports taking his medications most days, but occasionally misses doses of carvedilol and lisinopril-HCTZ. Notes he does not always eat a healthy diet and has gained weight in last few months. Occasionally walks 1-2 miles with his dogs, but otherwise does not exercise regularly. BP checked at home ranges from SBP 125-140 and DBP 68-80.   He has history of palpitations for which he takes carvedilol. Before taking carvedilol, he used to have palpitations everyday but since starting the medication reports having them 2-3 times a month. Echocardiogram last done 01/03/14 for workup of palpitations was normal. Notes the palpitations seem to come on after physical activity or stress and rarely occur while at rest. No associated chest pain, SOB, DOE, dizziness, lightheadedness, syncope, nausea, diaphoresis. No fever, headaches, blurry vision, numbness or tingling, swelling in extremities. Denies any use of tobacco or alcohol.   Patient also reports worsening asthma due to seasonal allergies. Has increased use of albuterol to 2-3 times a day and would like refills on albuterol and advair.   Active Ambulatory Problems    Diagnosis Date Noted  . Essential hypertension, benign 10/01/2013  . Palpitations 10/01/2013  . Unspecified asthma(493.90) 10/01/2013  . Obesity (BMI 30-39.9) 11/17/2013  . DM (diabetes mellitus) 04/28/2014  . Essential hypertension 09/05/2014  . Asthma, chronic 05/29/2015  . OSA (obstructive sleep apnea) 05/29/2015   Resolved Ambulatory Problems    Diagnosis Date Noted  . No Resolved Ambulatory Problems   Past Medical History  Diagnosis Date  . Hypertension   . Asthma   . Palpitation       Medication List       This list is accurate as of: 05/29/15  3:32 PM.  Always use your most recent med list.               albuterol 108 (90 BASE) MCG/ACT inhaler  Commonly known as:  PROVENTIL HFA;VENTOLIN HFA  Inhale 2 puffs into the lungs every 6 (six) hours as needed for wheezing.     aspirin EC 81 MG tablet  Take 1 tablet (81 mg total) by mouth daily.     carvedilol 6.25 MG tablet  Commonly known as:  COREG  Take 1 tablet (6.25 mg total) by mouth 2 (two) times daily with a meal.     Fluticasone-Salmeterol 100-50 MCG/DOSE Aepb  Commonly known as:  ADVAIR  Inhale 1 puff into the lungs 2 (two) times daily.     lisinopril-hydrochlorothiazide 20-12.5 MG per tablet  Commonly known as:  PRINZIDE,ZESTORETIC  Take 1 tablet by mouth daily.         Review of Systems  Constitutional: Negative for fever, chills, activity change and fatigue.  Eyes: Negative for visual disturbance.  Respiratory: Negative for cough and shortness of breath.   Cardiovascular: Positive for palpitations. Negative for chest pain and leg swelling.  Gastrointestinal: Negative for abdominal pain.  Endocrine: Negative for polyuria.  Genitourinary: Negative for dysuria, urgency, frequency, flank pain, decreased urine volume and difficulty urinating.  Neurological: Negative for dizziness, syncope, light-headedness and headaches.   Objective: Filed Vitals:   05/29/15 1424  BP: 146/91  Pulse: 72  Temp: 98.5 F (36.9 C)  Physical Exam  Constitutional:  Well appearing, pleasant obese 39 yo AA man alert, oriented, and in no acute distress.  HENT:  Head: Normocephalic and atraumatic.  Right Ear: External ear normal.  Left Ear: External ear normal.  Mouth/Throat: Oropharynx is clear and moist.  Eyes: Conjunctivae and EOM are normal. Pupils are equal, round, and reactive to light.  Neck: Normal range of motion. Neck supple. No JVD present. No thyromegaly present.  Cardiovascular: Normal rate,  regular rhythm, normal heart sounds and intact distal pulses.   Pulmonary/Chest: Effort normal and breath sounds normal. He exhibits no tenderness.  Musculoskeletal: Normal range of motion. He exhibits no edema or tenderness.  Lymphadenopathy:    He has no cervical adenopathy.   Assessment & Plan:  [name] is a 39 yo African American male with BMI of 39.8 and history of asthma, palpitations, essential hypertension, and type 2 diabetes here today for a follow up visit.    Type 2 diabetes mellitus without complication Patient's A1C today is 6.5 and increased from A1C of 6.1 on 11/24/14. Patient discontinued use of metformin on his own because he was experiencing increased episodes of hypoglycemia. Wants to work on diet and exercise for next 3 months to achieve better glycemic control instead of restarting metformin now.  Orders: -     Glucose (CBG) -     HgB A1c -     COMPLETE METABOLIC PANEL WITH GFR  Essential hypertension, benign Patient's BP today is 146/91. Counseled patient on medication adherence, DASH diet, daily exercise for better BP control. Ordered following labs: Orders: -     lisinopril-hydrochlorothiazide (PRINZIDE,ZESTORETIC) 20-12.5 MG per tablet; Take 1 tablet by mouth daily. -     TSH -     Lipid panel  Palpitations Given patient's palpitations have decreased in frequency since being no carvedilol and given today's EKG and recent echocardiogram were both normal, no further workup necessary at this time. Counseled patient on importance of medication adherence with carvedilol. Orders: -     Electrocardiogram report: EKG shows normal sinus rhythm  -     carvedilol (COREG) 6.25 MG tablet; Take 1 tablet (6.25 mg total) by mouth 2 (two) times daily with a meal.  Asthma, chronic, moderate persistent, uncomplicated Refilled medications as follows: Orders: -     Fluticasone-Salmeterol (ADVAIR) 100-50 MCG/DOSE AEPB; Inhale 1 puff into the lungs 2 (two) times daily. -      albuterol (PROVENTIL HFA;VENTOLIN HFA) 108 (90 BASE) MCG/ACT inhaler; Inhale 2 puffs into the lungs every 6 (six) hours as needed for wheezing.  OSA (obstructive sleep apnea) Given patient is obese, reports snoring and not having sound sleep, will schedule for sleep study to evaluate for OSA. Orders: -     Split night study; Future   Evaluation and management procedures were performed by me with Medical Student in attendance, note written by medical student under my supervision and collaboration. I have reviewed the note and I agree with the management and plan.   Jeanann Lewandowsky, MD, MHA, CPE, FACP, FAAP [facility] [place], [place] [phone]   05/29/2015, 8:07 PM

## 2015-05-29 NOTE — Progress Notes (Signed)
Patient here to follow up on his HTN and type 2 DM CBG today 113 Patient states he was "put on blood pressure medications because of heart palpitations."  He states these are much better since starting the medication and that he still has 2-3 every couple of weeks. Patient states he has gained weight and knows he needs to lose some. Patient needs refills on his medications.

## 2015-05-30 ENCOUNTER — Telehealth: Payer: Self-pay

## 2015-05-30 NOTE — Telephone Encounter (Signed)
Nurse called patient, reached voicemail. Did not leave message for patient because mailbox is full.

## 2015-05-30 NOTE — Telephone Encounter (Signed)
-----   Message from Olugbemiga E Jegede, MD sent at 05/30/2015 12:13 PM EDT ----- Please inform patient that his lab results shows high cholesterol, and high blood sugar with hemoglobin A1c and diabetic range. We discussed the A1c more you prefer to do diet control and exercise, we also advise the same for the cholesterol. To address this please limit saturated fat to no more than 7% of your calories, limit cholesterol to 200 mg/day, increase fiber and exercise as tolerated. If needed we may add cholesterol lowering medication to your regimen. 

## 2015-05-31 NOTE — Telephone Encounter (Signed)
-----   Message from Quentin Angst, MD sent at 05/30/2015 12:13 PM EDT ----- Please inform patient that his lab results shows high cholesterol, and high blood sugar with hemoglobin A1c and diabetic range. We discussed the A1c more you prefer to do diet control and exercise, we also advise the same for the cholesterol. To address this please limit saturated fat to no more than 7% of your calories, limit cholesterol to 200 mg/day, increase fiber and exercise as tolerated. If needed we may add cholesterol lowering medication to your regimen.

## 2015-05-31 NOTE — Telephone Encounter (Signed)
Nurse called patients first contact number, reached person explaining wrong number. Nurse called second contact number, reached voicemail explaining person is unavailable but mailbox is full, no voicemail left.

## 2015-06-01 NOTE — Telephone Encounter (Signed)
-----   Message from Olugbemiga E Jegede, MD sent at 05/30/2015 12:13 PM EDT ----- Please inform patient that his lab results shows high cholesterol, and high blood sugar with hemoglobin A1c and diabetic range. We discussed the A1c more you prefer to do diet control and exercise, we also advise the same for the cholesterol. To address this please limit saturated fat to no more than 7% of your calories, limit cholesterol to 200 mg/day, increase fiber and exercise as tolerated. If needed we may add cholesterol lowering medication to your regimen. 

## 2015-06-01 NOTE — Telephone Encounter (Signed)
Nurse called  Mobile number, reached recording explaining person is unavailable but mailbox is full.

## 2015-06-02 NOTE — Telephone Encounter (Signed)
Nurse called mobile number, left message on voicemail. Nurse called home number, man answered explaining wrong number.

## 2015-06-02 NOTE — Telephone Encounter (Signed)
-----   Message from Olugbemiga E Jegede, MD sent at 05/30/2015 12:13 PM EDT ----- Please inform patient that his lab results shows high cholesterol, and high blood sugar with hemoglobin A1c and diabetic range. We discussed the A1c more you prefer to do diet control and exercise, we also advise the same for the cholesterol. To address this please limit saturated fat to no more than 7% of your calories, limit cholesterol to 200 mg/day, increase fiber and exercise as tolerated. If needed we may add cholesterol lowering medication to your regimen. 

## 2015-06-05 NOTE — Telephone Encounter (Signed)
Nurse called patient, person answered. Left message for "Ian Cochran" to return call to [place] at [phone].

## 2015-06-06 NOTE — Telephone Encounter (Signed)
Patient returned call to nurse, verified date of birth. Patient aware of high cholesterol and high blood sugar. Patient does prefer diet control and exercise to help with glucose level. Patient agrees to do the same with cholesterol. Patient aware of the following recommendations: limit saturated fat to no more than 7% of calories, limit cholesterol to 200mg /day, increase fiber and exercise as tolerated. Patient also aware of possible need to add cholesterol lowering agent to medication regimen. Patient voices understanding and has no questions at this time.

## 2015-06-06 NOTE — Telephone Encounter (Signed)
-----   Message from Olugbemiga E Jegede, MD sent at 05/30/2015 12:13 PM EDT ----- Please inform patient that his lab results shows high cholesterol, and high blood sugar with hemoglobin A1c and diabetic range. We discussed the A1c more you prefer to do diet control and exercise, we also advise the same for the cholesterol. To address this please limit saturated fat to no more than 7% of your calories, limit cholesterol to 200 mg/day, increase fiber and exercise as tolerated. If needed we may add cholesterol lowering medication to your regimen. 

## 2015-06-12 ENCOUNTER — Other Ambulatory Visit: Payer: Self-pay

## 2016-01-02 MED FILL — CARVEDILOL 6.25 MG TABLET: 6.25 | 30 days supply | Qty: 60 | Fill #0

## 2016-01-02 MED FILL — LISINOPRIL-HCTZ 20-12.5 MG: 20-12.5 | 30 days supply | Qty: 30 | Fill #0

## 2016-01-02 MED FILL — **ADVAIR 100/50 DISKUS: 100-50 MCG | 7 days supply | Qty: 14 | Fill #6

## 2016-01-02 MED FILL — VENTOLIN HFA 90 MCG INHALER: 108 (90 BAS | 25 days supply | Qty: 18 | Fill #6

## 2016-03-08 MED FILL — LISINOPRIL-HCTZ 20-12.5 MG: 20-12.5 | 30 days supply | Qty: 30 | Fill #1

## 2016-03-08 MED FILL — CARVEDILOL 6.25 MG TABLET: 6.25 | 30 days supply | Qty: 60 | Fill #1

## 2016-03-08 MED FILL — VENTOLIN HFA 90 MCG INHALER: 108 (90 BAS | 25 days supply | Qty: 18 | Fill #7

## 2016-04-04 ENCOUNTER — Ambulatory Visit: Payer: Self-pay | Attending: Internal Medicine | Admitting: Internal Medicine

## 2016-04-04 ENCOUNTER — Encounter: Payer: Self-pay | Admitting: Internal Medicine

## 2016-04-04 VITALS — BP 138/82 | HR 84 | Temp 98.0°F | Resp 16 | Ht 76.0 in | Wt 342.4 lb

## 2016-04-04 DIAGNOSIS — M1711 Unilateral primary osteoarthritis, right knee: Secondary | ICD-10-CM | POA: Insufficient documentation

## 2016-04-04 DIAGNOSIS — E119 Type 2 diabetes mellitus without complications: Secondary | ICD-10-CM | POA: Insufficient documentation

## 2016-04-04 DIAGNOSIS — Z79899 Other long term (current) drug therapy: Secondary | ICD-10-CM | POA: Insufficient documentation

## 2016-04-04 DIAGNOSIS — R002 Palpitations: Secondary | ICD-10-CM | POA: Insufficient documentation

## 2016-04-04 DIAGNOSIS — J4540 Moderate persistent asthma, uncomplicated: Secondary | ICD-10-CM

## 2016-04-04 DIAGNOSIS — J45909 Unspecified asthma, uncomplicated: Secondary | ICD-10-CM | POA: Insufficient documentation

## 2016-04-04 DIAGNOSIS — Z7982 Long term (current) use of aspirin: Secondary | ICD-10-CM | POA: Insufficient documentation

## 2016-04-04 DIAGNOSIS — I10 Essential (primary) hypertension: Secondary | ICD-10-CM | POA: Insufficient documentation

## 2016-04-04 DIAGNOSIS — Z6841 Body Mass Index (BMI) 40.0 and over, adult: Secondary | ICD-10-CM | POA: Insufficient documentation

## 2016-04-04 LAB — GLUCOSE, POCT (MANUAL RESULT ENTRY): POC Glucose: 106 mg/dl — AB (ref 70–99)

## 2016-04-04 LAB — POCT GLYCOSYLATED HEMOGLOBIN (HGB A1C): HEMOGLOBIN A1C: 6.4

## 2016-04-04 MED ORDER — CARVEDILOL 6.25 MG PO TABS: 6.2500 mg | ORAL_TABLET | Freq: Two times a day (BID) | ORAL | Status: AC

## 2016-04-04 MED ORDER — ALBUTEROL SULFATE HFA 108 (90 BASE) MCG/ACT IN AERS: 2.0000 | INHALATION_SPRAY | Freq: Four times a day (QID) | RESPIRATORY_TRACT | Status: DC | PRN

## 2016-04-04 MED ORDER — LISINOPRIL-HYDROCHLOROTHIAZIDE 20-12.5 MG PO TABS: 1.0000 | ORAL_TABLET | Freq: Every day | ORAL | Status: AC

## 2016-04-04 MED ORDER — ACETAMINOPHEN-CODEINE #3 300-30 MG PO TABS: 1.0000 | ORAL_TABLET | ORAL | Status: AC | PRN

## 2016-04-04 NOTE — Patient Instructions (Signed)
DASH Eating Plan DASH stands for "Dietary Approaches to Stop Hypertension." The DASH eating plan is a healthy eating plan that has been shown to reduce high blood pressure (hypertension). Additional health benefits may include reducing the risk of type 2 diabetes mellitus, heart disease, and stroke. The DASH eating plan may also help with weight loss. WHAT DO I NEED TO KNOW ABOUT THE DASH EATING PLAN? For the DASH eating plan, you will follow these general guidelines:  Choose foods with a percent daily value for sodium of less than 5% (as listed on the food label).  Use salt-free seasonings or herbs instead of table salt or sea salt.  Check with your health care provider or pharmacist before using salt substitutes.  Eat lower-sodium products, often labeled as "lower sodium" or "no salt added."  Eat fresh foods.  Eat more vegetables, fruits, and low-fat dairy products.  Choose whole grains. Look for the word "whole" as the first word in the ingredient list.  Choose fish and skinless chicken or turkey more often than red meat. Limit fish, poultry, and meat to 6 oz (170 g) each day.  Limit sweets, desserts, sugars, and sugary drinks.  Choose heart-healthy fats.  Limit cheese to 1 oz (28 g) per day.  Eat more home-cooked food and less restaurant, buffet, and fast food.  Limit fried foods.  Cook foods using methods other than frying.  Limit canned vegetables. If you do use them, rinse them well to decrease the sodium.  When eating at a restaurant, ask that your food be prepared with less salt, or no salt if possible. WHAT FOODS CAN I EAT? Seek help from a dietitian for individual calorie needs. Grains Whole grain or whole wheat bread. Brown rice. Whole grain or whole wheat pasta. Quinoa, bulgur, and whole grain cereals. Low-sodium cereals. Corn or whole wheat flour tortillas. Whole grain cornbread. Whole grain crackers. Low-sodium crackers. Vegetables Fresh or frozen vegetables  (raw, steamed, roasted, or grilled). Low-sodium or reduced-sodium tomato and vegetable juices. Low-sodium or reduced-sodium tomato sauce and paste. Low-sodium or reduced-sodium canned vegetables.  Fruits All fresh, canned (in natural juice), or frozen fruits. Meat and Other Protein Products Ground beef (85% or leaner), grass-fed beef, or beef trimmed of fat. Skinless chicken or turkey. Ground chicken or turkey. Pork trimmed of fat. All fish and seafood. Eggs. Dried beans, peas, or lentils. Unsalted nuts and seeds. Unsalted canned beans. Dairy Low-fat dairy products, such as skim or 1% milk, 2% or reduced-fat cheeses, low-fat ricotta or cottage cheese, or plain low-fat yogurt. Low-sodium or reduced-sodium cheeses. Fats and Oils Tub margarines without trans fats. Light or reduced-fat mayonnaise and salad dressings (reduced sodium). Avocado. Safflower, olive, or canola oils. Natural peanut or almond butter. Other Unsalted popcorn and pretzels. The items listed above may not be a complete list of recommended foods or beverages. Contact your dietitian for more options. WHAT FOODS ARE NOT RECOMMENDED? Grains White bread. White pasta. White rice. Refined cornbread. Bagels and croissants. Crackers that contain trans fat. Vegetables Creamed or fried vegetables. Vegetables in a cheese sauce. Regular canned vegetables. Regular canned tomato sauce and paste. Regular tomato and vegetable juices. Fruits Dried fruits. Canned fruit in light or heavy syrup. Fruit juice. Meat and Other Protein Products Fatty cuts of meat. Ribs, chicken wings, bacon, sausage, bologna, salami, chitterlings, fatback, hot dogs, bratwurst, and packaged luncheon meats. Salted nuts and seeds. Canned beans with salt. Dairy Whole or 2% milk, cream, half-and-half, and cream cheese. Whole-fat or sweetened yogurt. Full-fat   cheeses or blue cheese. Nondairy creamers and whipped toppings. Processed cheese, cheese spreads, or cheese  curds. Condiments Onion and garlic salt, seasoned salt, table salt, and sea salt. Canned and packaged gravies. Worcestershire sauce. Tartar sauce. Barbecue sauce. Teriyaki sauce. Soy sauce, including reduced sodium. Steak sauce. Fish sauce. Oyster sauce. Cocktail sauce. Horseradish. Ketchup and mustard. Meat flavorings and tenderizers. Bouillon cubes. Hot sauce. Tabasco sauce. Marinades. Taco seasonings. Relishes. Fats and Oils Butter, stick margarine, lard, shortening, ghee, and bacon fat. Coconut, palm kernel, or palm oils. Regular salad dressings. Other Pickles and olives. Salted popcorn and pretzels. The items listed above may not be a complete list of foods and beverages to avoid. Contact your dietitian for more information. WHERE CAN I FIND MORE INFORMATION? National Heart, Lung, [employer] Institute: www.nhlbi.nih.gov/health/health-topics/topics/dash/   This information is not intended to replace advice given to you by your health care provider. Make sure you discuss any questions you have with your health care provider.   Document Released: 11/21/2011 Document Revised: 12/23/2014 Document Reviewed: 10/06/2013 Elsevier Interactive Patient Education 2016 Elsevier Inc. Hypertension Hypertension, commonly called high blood pressure, is when the force of blood pumping through your arteries is too strong. Your arteries are the blood vessels that carry blood from your heart throughout your body. A blood pressure reading consists of a higher number over a lower number, such as 110/72. The higher number (systolic) is the pressure inside your arteries when your heart pumps. The lower number (diastolic) is the pressure inside your arteries when your heart relaxes. Ideally you want your blood pressure below 120/80. Hypertension forces your heart to work harder to pump blood. Your arteries may become narrow or stiff. Having untreated or uncontrolled hypertension can cause heart attack, stroke, kidney  disease, and other problems. RISK FACTORS Some risk factors for high blood pressure are controllable. Others are not.  Risk factors you cannot control include:   Race. You may be at higher risk if you are African American.  Age. Risk increases with age.  Gender. Men are at higher risk than women before age 45 years. After age 65, women are at higher risk than men. Risk factors you can control include:  Not getting enough exercise or physical activity.  Being overweight.  Getting too much fat, sugar, calories, or salt in your diet.  Drinking too much alcohol. SIGNS AND SYMPTOMS Hypertension does not usually cause signs or symptoms. Extremely high blood pressure (hypertensive crisis) may cause headache, anxiety, shortness of breath, and nosebleed. DIAGNOSIS To check if you have hypertension, your health care provider will measure your blood pressure while you are seated, with your arm held at the level of your heart. It should be measured at least twice using the same arm. Certain conditions can cause a difference in blood pressure between your right and left arms. A blood pressure reading that is higher than normal on one occasion does not mean that you need treatment. If it is not clear whether you have high blood pressure, you may be asked to return on a different day to have your blood pressure checked again. Or, you may be asked to monitor your blood pressure at home for 1 or more weeks. TREATMENT Treating high blood pressure includes making lifestyle changes and possibly taking medicine. Living a healthy lifestyle can help lower high blood pressure. You may need to change some of your habits. Lifestyle changes may include:  Following the DASH diet. This diet is high in fruits, vegetables, and whole grains.   It is low in salt, red meat, and added sugars.  Keep your sodium intake below 2,300 mg per day.  Getting at least 30-45 minutes of aerobic exercise at least 4 times per  week.  Losing weight if necessary.  Not smoking.  Limiting alcoholic beverages.  Learning ways to reduce stress. Your health care provider may prescribe medicine if lifestyle changes are not enough to get your blood pressure under control, and if one of the following is true:  You are 18-59 years of age and your systolic blood pressure is above 140.  You are 60 years of age or older, and your systolic blood pressure is above 150.  Your diastolic blood pressure is above 90.  You have diabetes, and your systolic blood pressure is over 140 or your diastolic blood pressure is over 90.  You have kidney disease and your blood pressure is above 140/90.  You have heart disease and your blood pressure is above 140/90. Your personal target blood pressure may vary depending on your medical conditions, your age, and other factors. HOME CARE INSTRUCTIONS  Have your blood pressure rechecked as directed by your health care provider.   Take medicines only as directed by your health care provider. Follow the directions carefully. Blood pressure medicines must be taken as prescribed. The medicine does not work as well when you skip doses. Skipping doses also puts you at risk for problems.  Do not smoke.   Monitor your blood pressure at home as directed by your health care provider. SEEK MEDICAL CARE IF:   You think you are having a reaction to medicines taken.  You have recurrent headaches or feel dizzy.  You have swelling in your ankles.  You have trouble with your vision. SEEK IMMEDIATE MEDICAL CARE IF:  You develop a severe headache or confusion.  You have unusual weakness, numbness, or feel faint.  You have severe chest or abdominal pain.  You vomit repeatedly.  You have trouble breathing. MAKE SURE YOU:   Understand these instructions.  Will watch your condition.  Will get help right away if you are not doing well or get worse.   This information is not intended to  replace advice given to you by your health care provider. Make sure you discuss any questions you have with your health care provider.   Document Released: 12/02/2005 Document Revised: 04/18/2015 Document Reviewed: 09/24/2013 Elsevier Interactive Patient Education 2016 Elsevier Inc. Diabetes and Exercise Exercising regularly is important. It is not just about losing weight. It has many health benefits, such as:  Improving your overall fitness, flexibility, and endurance.  Increasing your bone density.  Helping with weight control.  Decreasing your body fat.  Increasing your muscle strength.  Reducing stress and tension.  Improving your overall health. People with diabetes who exercise gain additional benefits because exercise:  Reduces appetite.  Improves the body's use of blood sugar (glucose).  Helps lower or control blood glucose.  Decreases blood pressure.  Helps control blood lipids (such as cholesterol and triglycerides).  Improves the body's use of the hormone insulin by:  Increasing the body's insulin sensitivity.  Reducing the body's insulin needs.  Decreases the risk for heart disease because exercising:  Lowers cholesterol and triglycerides levels.  Increases the levels of good cholesterol (such as high-density lipoproteins [HDL]) in the body.  Lowers blood glucose levels. YOUR ACTIVITY PLAN  Choose an activity that you enjoy, and set realistic goals. To exercise safely, you should begin practicing any new physical   activity slowly, and gradually increase the intensity of the exercise over time. Your health care provider or diabetes educator can help create an activity plan that works for you. General recommendations include:  Encouraging children to engage in at least 60 minutes of physical activity each day.  Stretching and performing strength training exercises, such as yoga or weight lifting, at least 2 times per week.  Performing a total of at least  150 minutes of moderate-intensity exercise each week, such as brisk walking or water aerobics.  Exercising at least 3 days per week, making sure you allow no more than 2 consecutive days to pass without exercising.  Avoiding long periods of inactivity (90 minutes or more). When you have to spend an extended period of time sitting down, take frequent breaks to walk or stretch. RECOMMENDATIONS FOR EXERCISING WITH TYPE 1 OR TYPE 2 DIABETES   Check your blood glucose before exercising. If blood glucose levels are greater than 240 mg/dL, check for urine ketones. Do not exercise if ketones are present.  Avoid injecting insulin into areas of the body that are going to be exercised. For example, avoid injecting insulin into:  The arms when playing tennis.  The legs when jogging.  Keep a record of:  Food intake before and after you exercise.  Expected peak times of insulin action.  Blood glucose levels before and after you exercise.  The type and amount of exercise you have done.  Review your records with your health care provider. Your health care provider will help you to develop guidelines for adjusting food intake and insulin amounts before and after exercising.  If you take insulin or oral hypoglycemic agents, watch for signs and symptoms of hypoglycemia. They include:  Dizziness.  Shaking.  Sweating.  Chills.  Confusion.  Drink plenty of water while you exercise to prevent dehydration or heat stroke. Body water is lost during exercise and must be replaced.  Talk to your health care provider before starting an exercise program to make sure it is safe for you. Remember, almost any type of activity is better than none.   This information is not intended to replace advice given to you by your health care provider. Make sure you discuss any questions you have with your health care provider.   Document Released: 02/22/2004 Document Revised: 04/18/2015 Document Reviewed:  05/11/2013 Elsevier Interactive Patient Education 2016 Elsevier Inc. Obesity Obesity is defined as having too much total body fat and a body mass index (BMI) of 30 or more. BMI is an estimate of body fat and is calculated from your height and weight. BMI is typically calculated by your health care provider during regular wellness visits. Obesity happens when you consume more calories than you can burn by exercising or performing daily physical tasks. Prolonged obesity can cause major illnesses or emergencies, such as:  Stroke.  Heart disease.  Diabetes.  Cancer.  Arthritis.  High blood pressure (hypertension).  High cholesterol.  Sleep apnea.  Erectile dysfunction.  Infertility problems. CAUSES   Regularly eating unhealthy foods.  Physical inactivity.  Certain disorders, such as an underactive thyroid (hypothyroidism), Cushing's syndrome, and polycystic ovarian syndrome.  Certain medicines, such as steroids, some depression medicines, and antipsychotics.  Genetics.  Lack of sleep. DIAGNOSIS A health care provider can diagnose obesity after calculating your BMI. Obesity will be diagnosed if your BMI is 30 or higher. There are other methods of measuring obesity levels. Some other methods include measuring your skinfold thickness, your waist circumference, and  comparing your hip circumference to your waist circumference. TREATMENT  A healthy treatment program includes some or all of the following:  Long-term dietary changes.  Exercise and physical activity.  Behavioral and lifestyle changes.  Medicine only under the supervision of your health care provider. Medicines may help, but only if they are used with diet and exercise programs. If your BMI is 40 or higher, your health care provider may recommend specialized surgery or programs to help with weight loss. An unhealthy treatment program includes:  Fasting.  Fad diets.  Supplements and drugs. These choices do  not succeed in long-term weight control. HOME CARE INSTRUCTIONS  Exercise and perform physical activity as directed by your health care provider. To increase physical activity, try the following:  Use stairs instead of elevators.  Park farther away from store entrances.  Garden, bike, or walk instead of watching television or using the computer.  Eat healthy, low-calorie foods and drinks on a regular basis. Eat more fruits and vegetables. Use low-calorie cookbooks or take healthy cooking classes.  Limit fast food, sweets, and processed snack foods.  Eat smaller portions.  Keep a daily journal of everything you eat. There are many free websites to help you with this. It may be helpful to measure your foods so you can determine if you are eating the correct portion sizes.  Avoid drinking alcohol. Drink more water and drinks without calories.  Take vitamins and supplements only as recommended by your health care provider.  Weight-loss support groups, Government social research officerregistered dietitians, counselors, and stress reduction education can also be very helpful. SEEK IMMEDIATE MEDICAL CARE IF:  You have chest pain or tightness.  You have trouble breathing or feel short of breath.  You have weakness or leg numbness.  You feel confused or have trouble talking.  You have sudden changes in your vision.   This information is not intended to replace advice given to you by your health care provider. Make sure you discuss any questions you have with your health care provider.   Document Released: 01/09/2005 Document Revised: 12/23/2014 Document Reviewed: 01/08/2012 Elsevier Interactive Patient Education [employer]2016 Else[employer] Inc.

## 2016-04-04 NOTE — Progress Notes (Signed)
Patient's here for f/up DM and HTN.  Patient c/o R knee pain with swelling and fluid on the knee x 1wk ago post workout. Pain rated 5/10.  Patient reports taken ibuprofen with some relief.  Patient requesting med refill on Albuterol,   Carvedilol, lisinopril/Hydrochlorothiazide.

## 2016-04-04 NOTE — Progress Notes (Signed)
Patient ID: Ian Cochran, male   DOB: 11/17/1976, 40 y.o.   MRN: [id number]   Ian Cochran, is a 40 y.o. male  [id number]  [id number]  DOB - 12-27-1975  Chief Complaint  Patient presents with  . Follow-up  . Diabetes  . Hypertension        Subjective:   Ian Cochran is a 40 y.o. male with history of asthma, essential hypertension, type 2 diabetes mellitus, palpitations and morbid obesity with BMI of about 40 here today for a follow up visit and medication refills. His major complaint today is right knee pain with some swelling that looks fluid collection for about 1 week. Pain started after a vigorous workout at the gym, he did not remember any sprain or strain on the joint, he thinks it  was just his normal workout but his knee swelled up the following day and has been painful ever since. Swelling is down at the moment but still having pain. No redness. No history of insect bite. No other joint pain or swelling. Patient has had surgery on his left knee joint in the past following an anterior cruciate ligament tear while playing basketball. He is currently undergoing some stress at home from a sick mother and he and his wife are expecting a baby in October 2017. Patient has No headache, No chest pain, No abdominal pain - No Nausea, No new weakness tingling or numbness, No Cough - SOB.  Problem  Primary Osteoarthritis of Right Knee    ALLERGIES: Allergies  Allergen Reactions  . Oxycodone     PAST MEDICAL HISTORY: Past Medical History  Diagnosis Date  . Hypertension   . Asthma   . Palpitation     MEDICATIONS AT HOME: Prior to Admission medications   Medication Sig Start Date End Date Taking? Authorizing Provider  albuterol (PROVENTIL HFA;VENTOLIN HFA) 108 (90 Base) MCG/ACT inhaler Inhale 2 puffs into the lungs every 6 (six) hours as needed for wheezing. 04/04/16  Yes Quentin Angst, MD  aspirin EC 81 MG tablet Take 1 tablet (81 mg total) by mouth daily. 10/29/13   Yes Leroy Sea, MD  carvedilol (COREG) 6.25 MG tablet Take 1 tablet (6.25 mg total) by mouth 2 (two) times daily with a meal. 04/04/16  Yes Oktober Glazer E Hyman Hopes, MD  Fluticasone-Salmeterol (ADVAIR) 100-50 MCG/DOSE AEPB Inhale 1 puff into the lungs 2 (two) times daily. 05/29/15  Yes Quentin Angst, MD  lisinopril-hydrochlorothiazide (PRINZIDE,ZESTORETIC) 20-12.5 MG tablet Take 1 tablet by mouth daily. 04/04/16  Yes Quentin Angst, MD  acetaminophen-codeine (TYLENOL #3) 300-30 MG tablet Take 1 tablet by mouth every 4 (four) hours as needed. 04/04/16   Quentin Angst, MD     Objective:   Filed Vitals:   04/04/16 1512  BP: 138/82  Pulse: 84  Temp: 98 F (36.7 C)  TempSrc: Oral  Resp: 16  Height:  (1.93 m)  Weight: 342 lb 6.4 oz (155.312 kg)  SpO2: 98%    Exam General appearance : Awake, alert, not in any distress. Speech Clear. Not toxic looking HEENT: Atraumatic and Normocephalic, pupils equally reactive to light and accomodation Neck: supple, no JVD. No cervical lymphadenopathy.  Chest:Good air entry bilaterally, no added sounds  CVS: S1 S2 regular, no murmurs.  Abdomen: Bowel sounds present, Non tender and not distended with no gaurding, rigidity or rebound. Extremities: B/L Lower Ext shows no edema, both legs are warm to touch. No swelling appreciated on right knee joint, left knee joint  with healed surgical scar, normal ranges of motion bilaterally. Neurology: Awake alert, and oriented X 3, CN II-XII intact, Non focal Skin:No Rash  Data Review Lab Results  Component Value Date   HGBA1C 6.4 04/04/2016   HGBA1C 6.50 05/29/2015   HGBA1C 6.1 11/24/2014     Assessment & Plan   1. Type 2 diabetes mellitus without complication, unspecified long term insulin use status (HCC)  Diet controlled, emphasized  - Glucose (CBG) - POCT A1C is 6.4% today - Microalbumin, urine Aim for 30 minutes of exercise most days. Rethink what you drink. Water is  great! Aim for 2-3 Carb Choices per meal (30-45 grams) +/- 1 either way  Aim for 0-15 Carbs per snack if hungry  Include protein in moderation with your meals and snacks  Consider reading food labels for Total Carbohydrate and Fat Grams of foods  Consider checking BG at alternate times per day  Continue taking medication as directed Be mindful about how much sugar you are adding to beverages and other foods. Fruit Punch - find one with no sugar  Measure and decrease portions of carbohydrate foods  Make your plate and don't go back for seconds   2. Asthma, chronic, moderate persistent, uncomplicated Refill - albuterol (PROVENTIL HFA;VENTOLIN HFA) 108 (90 Base) MCG/ACT inhaler; Inhale 2 puffs into the lungs every 6 (six) hours as needed for wheezing.  Dispense: 3 Inhaler; Refill: 3  3. Palpitations Refill - carvedilol (COREG) 6.25 MG tablet; Take 1 tablet (6.25 mg total) by mouth 2 (two) times daily with a meal.  Dispense: 180 tablet; Refill: 3  4. Essential hypertension, benign  - lisinopril-hydrochlorothiazide (PRINZIDE,ZESTORETIC) 20-12.5 MG tablet; Take 1 tablet by mouth daily.  Dispense: 90 tablet; Refill: 3  We have discussed target BP range and blood pressure goal. I have advised patient to check BP regularly and to call us back or report to clinic if the numbers are consistently higher than 140/90. We discussed the importance of compliance with medical therapy and DASH diet recommended, consequences of uncontrolled hypertension discussed.   - continue current BP medications  5. Primary osteoarthritis of right knee  - acetaminophen-codeine (TYLENOL #3) 300-30 MG tablet; Take 1 tablet by mouth every 4 (four) hours as needed.  Dispense: 60 tablet; Refill: 0  Patient have been counseled extensively about nutrition and exercise  Return in about 3 months (around 07/04/2016) for Hemoglobin A1C and Follow up, DM, Follow up HTN, Follow up Pain and comorbidities.  The patient was  given clear instructions to go to ER or return to medical center if symptoms don't improve, worsen or new problems develop. The patient verbalized understanding. The patient was told to call to get lab results if they haven't heard anything in the next week.   This note has been created with Education officer, environmentalDragon speech recognition software and smart phrase technology. Any transcriptional errors are unintentional.    Jeanann LewandowskyJEGEDE, Abraham Margulies, MD, MHA, FACP, FAAP, CPE [facility] [place], [place] [phone]   04/04/2016, 3:59 PM

## 2016-04-16 MED FILL — CARVEDILOL 6.25 MG TABLET: 6.25 | 30 days supply | Qty: 60 | Fill #0

## 2016-04-16 MED FILL — LISINOPRIL-HCTZ 20-12.5 MG: 20-12.5 | 30 days supply | Qty: 30 | Fill #0

## 2016-04-16 MED FILL — !VENTOLIN HFA INHALER: 108 (90 BAS | 30 days supply | Qty: 18 | Fill #0

## 2016-05-24 MED FILL — LISINOPRIL-HCTZ 20-12.5 MG: 20-12.5 | 30 days supply | Qty: 30 | Fill #1

## 2016-05-24 MED FILL — ?CARVEDILOL 6.25 MG TABLET: 6.25 | 30 days supply | Qty: 60 | Fill #1

## 2016-05-27 MED FILL — !VENTOLIN HFA INHALER: 108 (90 BAS | 25 days supply | Qty: 18 | Fill #8

## 2016-06-24 ENCOUNTER — Other Ambulatory Visit: Payer: Self-pay | Admitting: Internal Medicine

## 2016-06-24 MED FILL — VENTOLIN HFA 90 MCG INHALER: 108 (90 BAS | 30 days supply | Qty: 18 | Fill #1

## 2016-06-24 MED FILL — LISINOPRIL-HCTZ 20-12.5 MG: 20-12.5 | 30 days supply | Qty: 30 | Fill #2

## 2016-06-24 MED FILL — CARVEDILOL 6.25 MG TABLET: 6.25 | 30 days supply | Qty: 60 | Fill #2 | Status: TO

## 2016-07-31 MED FILL — LISINOPRIL-HCTZ 20-12.5 MG: 20-12.5 | 30 days supply | Qty: 30 | Fill #3 | Status: TO

## 2016-07-31 MED FILL — VENTOLIN HFA 90 MCG INHALER: 108 (90 BAS | 30 days supply | Qty: 18 | Fill #2

## 2016-08-22 ENCOUNTER — Other Ambulatory Visit: Payer: Self-pay | Admitting: Internal Medicine

## 2016-09-05 ENCOUNTER — Ambulatory Visit: Payer: Self-pay | Admitting: Internal Medicine

## 2016-10-14 ENCOUNTER — Other Ambulatory Visit: Payer: Self-pay | Admitting: Pharmacist

## 2016-10-14 MED ORDER — FLUTICASONE-SALMETEROL 100-50 MCG/DOSE IN AEPB: 1.0000 | INHALATION_SPRAY | Freq: Two times a day (BID) | RESPIRATORY_TRACT | 0 refills | Status: DC

## 2016-11-11 ENCOUNTER — Other Ambulatory Visit: Payer: Self-pay | Admitting: Pharmacist

## 2016-11-11 MED ORDER — FLUTICASONE-SALMETEROL 100-50 MCG/DOSE IN AEPB: 1.0000 | INHALATION_SPRAY | Freq: Two times a day (BID) | RESPIRATORY_TRACT | 0 refills | Status: DC

## 2017-02-12 ENCOUNTER — Other Ambulatory Visit: Payer: Self-pay | Admitting: Internal Medicine

## 2017-04-06 ENCOUNTER — Other Ambulatory Visit: Payer: Self-pay | Admitting: Internal Medicine

## 2017-05-09 ENCOUNTER — Other Ambulatory Visit: Payer: Self-pay | Admitting: Internal Medicine

## 2017-05-09 DIAGNOSIS — I10 Essential (primary) hypertension: Secondary | ICD-10-CM

## 2017-05-10 ENCOUNTER — Other Ambulatory Visit: Payer: Self-pay | Admitting: Internal Medicine

## 2017-05-10 DIAGNOSIS — R002 Palpitations: Secondary | ICD-10-CM

## 2018-11-24 ENCOUNTER — Other Ambulatory Visit: Payer: Self-pay

## 2018-11-24 ENCOUNTER — Emergency Department
Admission: EM | Admit: 2018-11-24 | Discharge: 2018-11-24 | Disposition: A | Discharge status: Home / Self Care | Payer: BLUE CROSS/BLUE SHIELD | Attending: Emergency Medicine | Admitting: Emergency Medicine

## 2018-11-24 ENCOUNTER — Emergency Department: Payer: BLUE CROSS/BLUE SHIELD

## 2018-11-24 ENCOUNTER — Encounter: Payer: Self-pay | Admitting: Emergency Medicine

## 2018-11-24 DIAGNOSIS — E119 Type 2 diabetes mellitus without complications: Secondary | ICD-10-CM | POA: Diagnosis not present

## 2018-11-24 DIAGNOSIS — R0602 Shortness of breath: Secondary | ICD-10-CM | POA: Diagnosis present

## 2018-11-24 DIAGNOSIS — J4521 Mild intermittent asthma with (acute) exacerbation: Secondary | ICD-10-CM | POA: Diagnosis not present

## 2018-11-24 DIAGNOSIS — I10 Essential (primary) hypertension: Secondary | ICD-10-CM | POA: Diagnosis not present

## 2018-11-24 LAB — BASIC METABOLIC PANEL
Anion gap: 7 (ref 5–15)
BUN: 17 mg/dL (ref 6–20)
CO2: 30 mmol/L (ref 22–32)
Calcium: 9.3 mg/dL (ref 8.9–10.3)
Chloride: 101 mmol/L (ref 98–111)
Creatinine, Ser: 1.14 mg/dL (ref 0.61–1.24)
GFR calc Af Amer: >60 mL/min (ref >60)
Glucose, Bld: 126 mg/dL — ABNORMAL HIGH (ref 70–99)
POTASSIUM: 4 mmol/L (ref 3.5–5.1)
Sodium: 138 mmol/L (ref 135–145)

## 2018-11-24 LAB — CBC
HEMATOCRIT: 41.9 % (ref 39.0–52.0)
HEMOGLOBIN: 13.2 g/dL (ref 13.0–17.0)
MCH: 25.3 pg — ABNORMAL LOW (ref 26.0–34.0)
MCHC: 31.5 g/dL (ref 30.0–36.0)
MCV: 80.4 fL (ref 80.0–100.0)
Platelets: 185 10*3/uL (ref 150–400)
RBC: 5.21 MIL/uL (ref 4.22–5.81)
RDW: 14.8 % (ref 11.5–15.5)
WBC: 5.3 10*3/uL (ref 4.0–10.5)
nRBC: 0 % (ref 0.0–0.2)

## 2018-11-24 LAB — TROPONIN I: Troponin I: <0.03 ng/mL (ref <0.03)

## 2018-11-24 MED ORDER — PREDNISONE 10 MG (21) PO TBPK: ORAL_TABLET | ORAL | 0 refills | Status: AC

## 2018-11-24 NOTE — ED Provider Notes (Signed)
[facility] Emergency Department Provider Note       Time seen: ----------------------------------------- 10:59 AM on 11/24/2018 -----------------------------------------   I have reviewed the triage vital signs and the nursing notes.  HISTORY   Chief Complaint Shortness of Breath    HPI Ian Cochran is a 42 y.o. male with a history of asthma, hypertension, heart palpitations who presents to the ED for shortness of breath and high blood pressure.  Patient states he is asthmatic, has been short of breath since yesterday.  Blood pressure at home was around 170.  He was sent to the ER mainly for blood pressure concerns.  He has been taking his medications as prescribed.  Past Medical History:  Diagnosis Date  . Asthma   . Hypertension   . Palpitation     Patient Active Problem List   Diagnosis Date Noted  . Primary osteoarthritis of right knee 04/04/2016  . Asthma, chronic 05/29/2015  . OSA (obstructive sleep apnea) 05/29/2015  . Essential hypertension 09/05/2014  . DM (diabetes mellitus) (HCC) 04/28/2014  . Obesity (BMI 30-39.9) 11/17/2013  . Essential hypertension, benign 10/01/2013  . Palpitations 10/01/2013  . Unspecified asthma(493.90) 10/01/2013    Past Surgical History:  Procedure Laterality Date  . ANTERIOR CRUCIATE LIGAMENT REPAIR      Allergies Oxycodone and Shellfish allergy  Social History Social History   Tobacco Use  . Smoking status: Never Smoker  . Smokeless tobacco: Never Used  Substance Use Topics  . Alcohol use: No  . Drug use: No   Review of Systems Constitutional: Negative for fever. Cardiovascular: Negative for chest pain. Respiratory: Positive for shortness of breath Gastrointestinal: Negative for abdominal pain, vomiting and diarrhea. Musculoskeletal: Negative for back pain. Skin: Negative for rash. Neurological: Negative for headaches, focal weakness or numbness.  All systems  negative/normal/unremarkable except as stated in the HPI  ____________________________________________   PHYSICAL EXAM:  VITAL SIGNS: ED Triage Vitals [11/24/18 0857]  Enc Vitals Group     BP (!) 169/91     Pulse Rate 85     Resp 16     Temp 98.5 F (36.9 C)     Temp Source Oral     SpO2 100 %     Weight (!) 371 lb (168.3 kg)     Height 6\' 3"  (1.905 m)     Head Circumference      Peak Flow      Pain Score 0     Pain Loc      Pain Edu?      Excl. in GC?    Constitutional: Alert and oriented. Well appearing and in no distress. ENT   Head: Normocephalic and atraumatic.   Nose: No congestion/rhinnorhea.   Mouth/Throat: Mucous membranes are moist.   Neck: No stridor. Cardiovascular: Normal rate, regular rhythm. No murmurs, rubs, or gallops. Respiratory: Normal respiratory effort without tachypnea nor retractions. Breath sounds are clear and equal bilaterally. No wheezes/rales/rhonchi. Musculoskeletal: Nontender with normal range of motion in extremities. No lower extremity tenderness nor edema. Neurologic:  Normal speech and language. No gross focal neurologic deficits are appreciated.  Skin:  Skin is warm, dry and intact. No rash noted. Psychiatric: Mood and affect are normal. Speech and behavior are normal.  ____________________________________________  EKG: Interpreted by me.  Sinus rhythm rate of 78 bpm, left axis deviation, normal QRS size, normal QT  ____________________________________________  ED COURSE:  As part of my medical decision making, I reviewed the following data within the electronic  MEDICAL RECORD NUMBER History obtained from family if available, nursing notes, old chart and ekg, as well as notes from prior ED visits. Patient presented for high blood pressure and asthma, we will assess with labs and imaging as indicated at this time.   Procedures ____________________________________________   LABS (pertinent positives/negatives)  Labs  Reviewed  BASIC METABOLIC PANEL - Abnormal; Notable for the following components:      Result Value   Glucose, Bld 126 (*)    All other components within normal limits  CBC - Abnormal; Notable for the following components:   MCH 25.3 (*)    All other components within normal limits  TROPONIN I    RADIOLOGY Images were viewed by me  Chest x-ray is unremarkable  ____________________________________________  DIFFERENTIAL DIAGNOSIS   Asthma, COPD, pneumonia, hypertension, renal failure  FINAL ASSESSMENT AND PLAN  Asthma, hypertension   Plan: The patient had presented for recent shortness of breath likely secondary to asthma.  Patient's blood pressure is mildly elevated but not concerning. Patient's labs are unremarkable. Patient's imaging was also unremarkable.  He will be discharged home with a steroid taper, encouraged to follow-up with his doctor and keep track of his blood pressure until then.   Ulice Dash, MD   Note: This note was generated in part or whole with voice recognition software. Voice recognition is usually quite accurate but there are transcription errors that can and very often do occur. I apologize for any typographical errors that were not detected and corrected.     Emily Filbert, MD 11/24/18 [phone]

## 2018-11-24 NOTE — ED Triage Notes (Signed)
[facility] and high bp.  Says he is asthmatic.  Sob since yesterday.

## 2020-04-30 IMAGING — CR DG CHEST 2V
1 series · 2 of 2 positions shown · non-contrast
Comparison: None.

CLINICAL DATA: Onset of shortness of breath yesterday. History of
asthma, hypertension common nonsmoker.

EXAM:
CHEST - 2 VIEW

[Series 1: dg chest 2 view · 0.14mm/px · 2 of 2 slices shown]
[im 1/2]
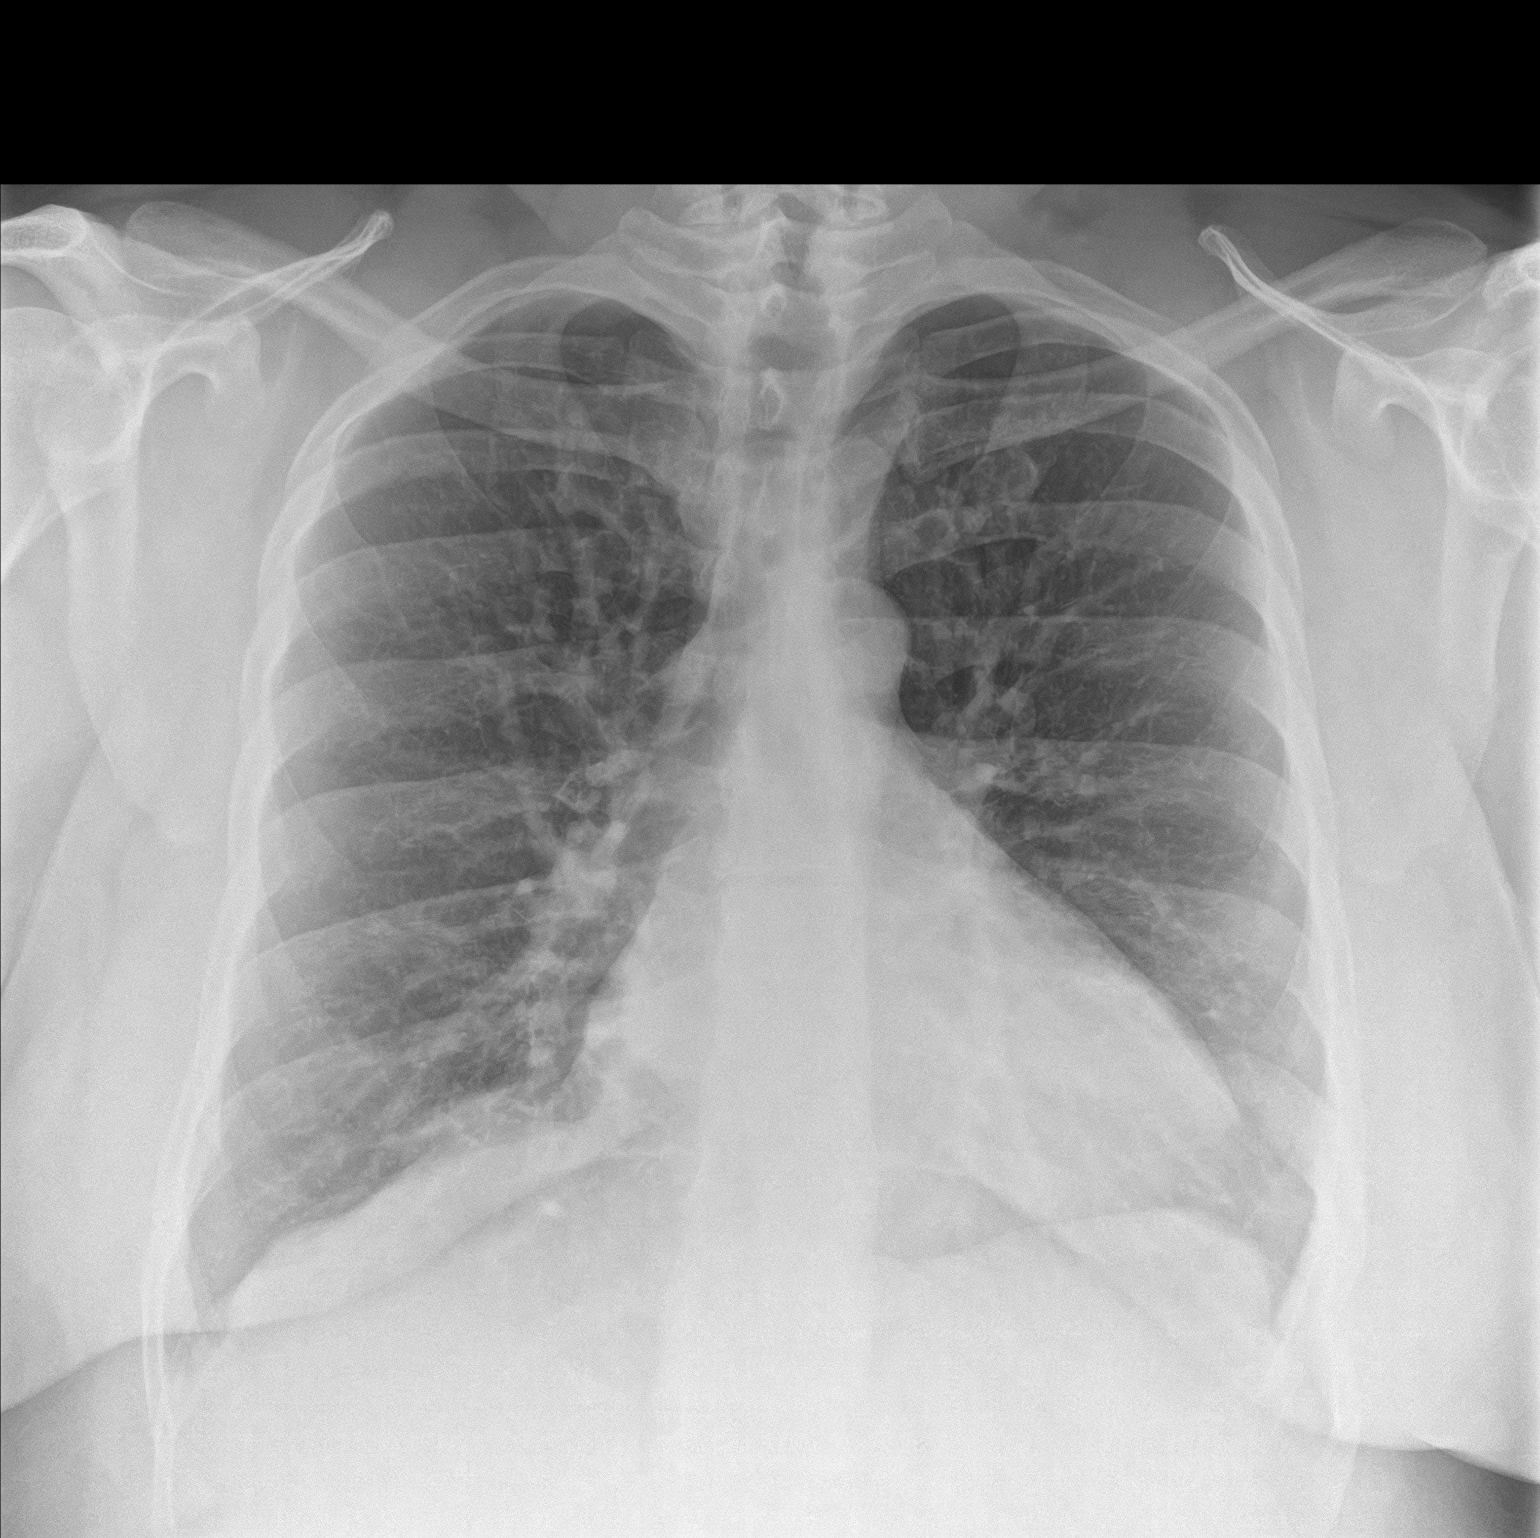
[im 2/2]
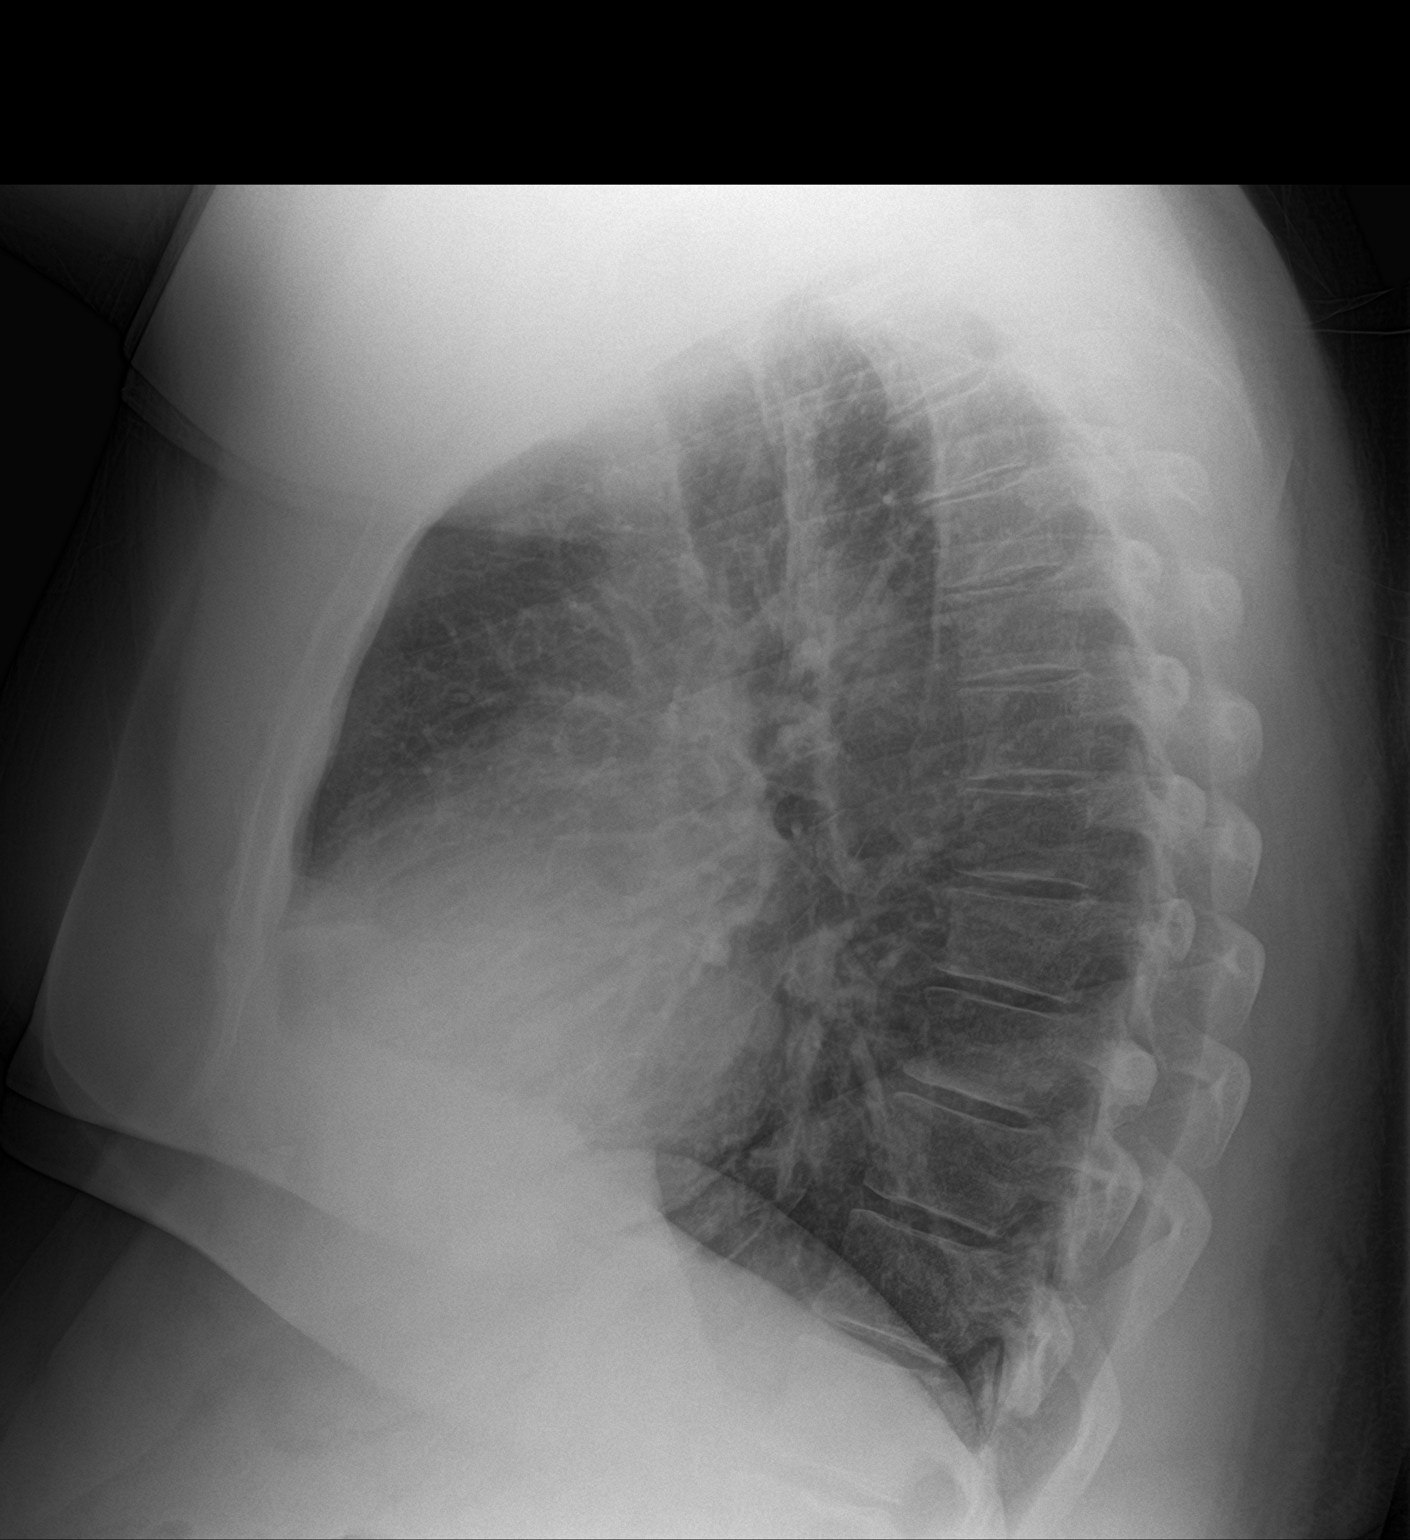

[2 of 2 positions shown; findings below may reference images not displayed]

FINDINGS: The lungs are mildly hyperinflated. The interstitial markings are
coarse. There is no alveolar infiltrate or pleural effusion. The
heart is top-normal in size. The pulmonary vascularity is not
engorged. The mediastinum is normal in width. The bony thorax is
unremarkable.
IMPRESSION: Chronic bronchitic-reactive airway changes. No alveolar pneumonia
nor pulmonary edema. Top-normal cardiac size.

## 2024-07-02 ENCOUNTER — Encounter: Payer: Self-pay | Admitting: Advanced Practice Midwife
